# Patient Record
Sex: Female | Born: 1990 | Race: Black or African American | Hispanic: No | Marital: Single | Smoking: Never smoker
Health system: Southern US, Community
[De-identification: ages and names within clinical notes are randomized; demographics above are authoritative.]

## PROBLEM LIST (undated history)

## (undated) ENCOUNTER — Ambulatory Visit: Admission: EM | Payer: Commercial Managed Care - HMO | Source: Home / Self Care

## (undated) ENCOUNTER — Inpatient Hospital Stay (HOSPITAL_COMMUNITY): Payer: Self-pay

## (undated) DIAGNOSIS — Z789 Other specified health status: Secondary | ICD-10-CM

## (undated) DIAGNOSIS — Z8619 Personal history of other infectious and parasitic diseases: Secondary | ICD-10-CM

## (undated) DIAGNOSIS — Z3490 Encounter for supervision of normal pregnancy, unspecified, unspecified trimester: Secondary | ICD-10-CM

## (undated) HISTORY — PX: NO PAST SURGERIES: SHX2092

## (undated) HISTORY — DX: Personal history of other infectious and parasitic diseases: Z86.19

## (undated) HISTORY — PX: OTHER SURGICAL HISTORY: SHX169

---
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
## 2011-05-19 ENCOUNTER — Emergency Department (HOSPITAL_BASED_OUTPATIENT_CLINIC_OR_DEPARTMENT_OTHER)
Admission: EM | Admit: 2011-05-19 | Discharge: 2011-05-19 | Disposition: A | Discharge status: Home / Self Care | Payer: Medicaid Other | Attending: Emergency Medicine | Admitting: Emergency Medicine

## 2011-05-19 ENCOUNTER — Emergency Department (INDEPENDENT_AMBULATORY_CARE_PROVIDER_SITE_OTHER): Payer: Medicaid Other

## 2011-05-19 ENCOUNTER — Encounter (HOSPITAL_BASED_OUTPATIENT_CLINIC_OR_DEPARTMENT_OTHER): Payer: Self-pay | Admitting: *Deleted

## 2011-05-19 DIAGNOSIS — R059 Cough, unspecified: Secondary | ICD-10-CM | POA: Insufficient documentation

## 2011-05-19 DIAGNOSIS — B349 Viral infection, unspecified: Secondary | ICD-10-CM

## 2011-05-19 DIAGNOSIS — R05 Cough: Secondary | ICD-10-CM

## 2011-05-19 DIAGNOSIS — B9789 Other viral agents as the cause of diseases classified elsewhere: Secondary | ICD-10-CM | POA: Insufficient documentation

## 2011-05-19 LAB — PREGNANCY, URINE: Preg Test, Ur: NEGATIVE

## 2011-05-19 MED ORDER — DESLORATADINE 5 MG PO TABS: 5.0000 mg | ORAL_TABLET | Freq: Every day | ORAL | Status: DC

## 2011-05-19 NOTE — ED Provider Notes (Signed)
History     CSN: [id number]  Arrival date & time 05/19/11  1922   First MD Initiated Contact with Patient 05/19/11 1925      Chief Complaint  Patient presents with  . Cough    (Consider location/radiation/quality/duration/timing/severity/associated sxs/prior treatment) Patient is a 21 y.o. female presenting with cough. The history is provided by the patient. No language interpreter was used.  Cough This is a new problem. The current episode started more than 1 week ago. The problem occurs every few minutes. The problem has not changed since onset.The cough is non-productive. There has been no fever. Associated symptoms include rhinorrhea. Pertinent negatives include no chest pain, no chills, no weight loss, no ear congestion, no ear pain, no headaches, no sore throat, no myalgias, no shortness of breath, no wheezing and no eye redness. She has tried decongestants for the symptoms. The treatment provided no relief. She is not a smoker. Her past medical history does not include pneumonia.  Just wants to be checked out because she has had a cold with dry cough for > 7 days.  No f/c/r.  PERC negative.  No leg swelling.  Rhinorrhea and nasal congestion  History reviewed. No pertinent past medical history.  History reviewed. No pertinent past surgical history.  No family history on file.  History  Substance Use Topics  . Smoking status: Never Smoker   . Smokeless tobacco: Not on file  . Alcohol Use: No    OB History    Grav Para Term Preterm Abortions TAB SAB Ect Mult Living                  Review of Systems  Constitutional: Negative for chills and weight loss.  HENT: Positive for rhinorrhea. Negative for ear pain and sore throat.   Eyes: Negative for redness.  Respiratory: Positive for cough. Negative for shortness of breath and wheezing.   Cardiovascular: Negative for chest pain.  Gastrointestinal: Negative.   Genitourinary: Negative.   Musculoskeletal: Negative.  Negative  for myalgias.  Neurological: Negative for headaches.  Hematological: Negative.   Psychiatric/Behavioral: Negative.     Allergies  Review of patient's allergies indicates no known allergies.  Home Medications   Current Outpatient Rx  Name Route Sig Dispense Refill  . DIPHENHYDRAMINE HCL 25 MG PO TABS Oral Take 50 mg by mouth once.    Marland Kitchen DM-PHENYLEPHRINE-ACETAMINOPHEN 10-5-325 MG PO CAPS Oral Take 2 capsules by mouth every 4 (four) hours as needed. For congestion    . PHENOL 1.4 % MT LIQD Mouth/Throat Use as directed 2 sprays in the mouth or throat as needed. For sore throat    . DESLORATADINE 5 MG PO TABS Oral Take 1 tablet (5 mg total) by mouth daily. 7 tablet 0    BP 122/69  Pulse 82  Temp(Src) 97.9 F (36.6 C) (Oral)  Resp 18  SpO2 100%  Physical Exam  Constitutional: She is oriented to person, place, and time. She appears well-developed and well-nourished. No distress.  HENT:  Head: Normocephalic and atraumatic.  Right Ear: Tympanic membrane is not injected.  Left Ear: Tympanic membrane is not injected.  Mouth/Throat: Oropharynx is clear and moist. No oropharyngeal exudate.  Eyes: Conjunctivae are normal. Pupils are equal, round, and reactive to light.  Neck: Normal range of motion. Neck supple.  Cardiovascular: Normal rate and regular rhythm.   Pulmonary/Chest: Effort normal and breath sounds normal. She has no wheezes. She has no rales.  Abdominal: Soft. Bowel sounds are normal. There  is no tenderness. There is no rebound and no guarding.  Musculoskeletal: Normal range of motion.  Lymphadenopathy:    She has no cervical adenopathy.  Neurological: She is alert and oriented to person, place, and time.  Skin: Skin is warm and dry. She is not diaphoretic.  Psychiatric: She has a normal mood and affect.    ED Course  Procedures (including critical care time)   Labs Reviewed  PREGNANCY, URINE   Dg Chest 2 View  05/19/2011  *RADIOLOGY REPORT*  Clinical Data: Cough.   CHEST - 2 VIEW  Comparison:  None.  Findings:  The heart size and mediastinal contours are within normal limits.  Both lungs are clear.  The visualized skeletal structures are unremarkable.  IMPRESSION: No active cardiopulmonary disease.  Original Report Authenticated By: Danae Orleans, M.D.     1. Viral syndrome       MDM  Follow up with your family doctor, return for fevers chills, shortness of breath or any concerns       Dagon Budai K Victoria Henshaw-Rasch, MD 05/19/11 2131

## 2011-05-19 NOTE — ED Notes (Signed)
Has had cold symptoms for four days, now feeling worse today. Body aches, coughing, congestion

## 2011-05-19 NOTE — ED Notes (Signed)
Pt. States she had a period for 5 days 2 wks ago and 2 days after that she spotted for one day.  Pt. Reports she has done home pregnancy test and negative x 2.  Pt. Reports she is not on [facility] and is no trying to get pregnant.  Pt. Has an appt. With OBGYN for female issues on the 25th or 26th of this month.   Pt. Reports she never got her BC pills filled 6 mths ago and she lost the script.

## 2011-10-26 ENCOUNTER — Encounter (HOSPITAL_BASED_OUTPATIENT_CLINIC_OR_DEPARTMENT_OTHER): Payer: Self-pay | Admitting: Emergency Medicine

## 2011-10-26 ENCOUNTER — Emergency Department (HOSPITAL_BASED_OUTPATIENT_CLINIC_OR_DEPARTMENT_OTHER)
Admission: EM | Admit: 2011-10-26 | Discharge: 2011-10-26 | Disposition: A | Discharge status: Home / Self Care | Payer: Medicaid Other | Attending: Emergency Medicine | Admitting: Emergency Medicine

## 2011-10-26 DIAGNOSIS — Z202 Contact with and (suspected) exposure to infections with a predominantly sexual mode of transmission: Secondary | ICD-10-CM | POA: Insufficient documentation

## 2011-10-26 LAB — URINALYSIS, ROUTINE W REFLEX MICROSCOPIC
Bilirubin Urine: NEGATIVE
Glucose, UA: NEGATIVE mg/dL
Hgb urine dipstick: NEGATIVE
Ketones, ur: NEGATIVE mg/dL
Leukocytes, UA: NEGATIVE
Nitrite: NEGATIVE
Protein, ur: NEGATIVE mg/dL
Specific Gravity, Urine: 1.035 — ABNORMAL HIGH (ref 1.005–1.030)
Urobilinogen, UA: 1 mg/dL (ref 0.0–1.0)
pH: 6 (ref 5.0–8.0)

## 2011-10-26 LAB — WET PREP, GENITAL: Yeast Wet Prep HPF POC: NONE SEEN

## 2011-10-26 LAB — PREGNANCY, URINE: Preg Test, Ur: NEGATIVE

## 2011-10-26 MED ORDER — AZITHROMYCIN 250 MG PO TABS
1000.0000 mg | ORAL_TABLET | Freq: Once | ORAL | Status: AC
  Administered 2011-10-26: 1000 mg via ORAL
  Filled 2011-10-26: qty 4

## 2011-10-26 MED ORDER — CEFTRIAXONE SODIUM 250 MG IJ SOLR
250.0000 mg | Freq: Once | INTRAMUSCULAR | Status: AC
  Administered 2011-10-26: 250 mg via INTRAMUSCULAR
  Filled 2011-10-26: qty 250

## 2011-10-26 MED ORDER — LIDOCAINE HCL 2 % IJ SOLN
INTRAMUSCULAR | Status: AC
  Administered 2011-10-26: 1 mg
  Filled 2011-10-26: qty 1
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
## 2011-10-29 NOTE — ED Notes (Signed)
+   Gonorrhea +chlamydia Patient treated with Rocephin and Zithromax.[facility] letter.
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
## 2011-12-09 ENCOUNTER — Encounter (HOSPITAL_BASED_OUTPATIENT_CLINIC_OR_DEPARTMENT_OTHER): Payer: Self-pay | Admitting: Emergency Medicine

## 2011-12-09 ENCOUNTER — Emergency Department (HOSPITAL_BASED_OUTPATIENT_CLINIC_OR_DEPARTMENT_OTHER)
Admission: EM | Admit: 2011-12-09 | Discharge: 2011-12-09 | Disposition: A | Discharge status: Home / Self Care | Payer: Medicaid Other | Attending: Emergency Medicine | Admitting: Emergency Medicine

## 2011-12-09 DIAGNOSIS — T22319A Burn of third degree of unspecified forearm, initial encounter: Secondary | ICD-10-CM | POA: Insufficient documentation

## 2011-12-09 DIAGNOSIS — Y92009 Unspecified place in unspecified non-institutional (private) residence as the place of occurrence of the external cause: Secondary | ICD-10-CM | POA: Insufficient documentation

## 2011-12-09 DIAGNOSIS — T300 Burn of unspecified body region, unspecified degree: Secondary | ICD-10-CM

## 2011-12-09 DIAGNOSIS — X19XXXA Contact with other heat and hot substances, initial encounter: Secondary | ICD-10-CM | POA: Insufficient documentation

## 2011-12-09 DIAGNOSIS — Z23 Encounter for immunization: Secondary | ICD-10-CM | POA: Insufficient documentation

## 2011-12-09 MED ORDER — TETANUS-DIPHTH-ACELL PERTUSSIS 5-2.5-18.5 LF-MCG/0.5 IM SUSP
0.5000 mL | Freq: Once | INTRAMUSCULAR | Status: AC
Start: 2011-12-09 — End: 2011-12-09
  Administered 2011-12-09: 0.5 mL via INTRAMUSCULAR
  Filled 2011-12-09: qty 0.5

## 2011-12-09 MED ORDER — SILVER SULFADIAZINE 1 % EX CREA
TOPICAL_CREAM | Freq: Once | CUTANEOUS | Status: AC
  Administered 2011-12-09: 1 via TOPICAL
  Filled 2011-12-09: qty 85
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
## 2012-03-10 ENCOUNTER — Emergency Department (HOSPITAL_BASED_OUTPATIENT_CLINIC_OR_DEPARTMENT_OTHER): Payer: Medicaid Other

## 2012-03-10 ENCOUNTER — Emergency Department (HOSPITAL_BASED_OUTPATIENT_CLINIC_OR_DEPARTMENT_OTHER)
Admission: EM | Admit: 2012-03-10 | Discharge: 2012-03-10 | Disposition: A | Discharge status: Home / Self Care | Payer: Medicaid Other | Attending: Emergency Medicine | Admitting: Emergency Medicine

## 2012-03-10 ENCOUNTER — Encounter (HOSPITAL_BASED_OUTPATIENT_CLINIC_OR_DEPARTMENT_OTHER): Payer: Self-pay

## 2012-03-10 DIAGNOSIS — N760 Acute vaginitis: Secondary | ICD-10-CM | POA: Insufficient documentation

## 2012-03-10 DIAGNOSIS — R109 Unspecified abdominal pain: Secondary | ICD-10-CM | POA: Insufficient documentation

## 2012-03-10 DIAGNOSIS — N949 Unspecified condition associated with female genital organs and menstrual cycle: Secondary | ICD-10-CM | POA: Insufficient documentation

## 2012-03-10 DIAGNOSIS — R002 Palpitations: Secondary | ICD-10-CM | POA: Insufficient documentation

## 2012-03-10 DIAGNOSIS — R102 Pelvic and perineal pain: Secondary | ICD-10-CM

## 2012-03-10 LAB — URINALYSIS, ROUTINE W REFLEX MICROSCOPIC
Ketones, ur: NEGATIVE mg/dL
Leukocytes, UA: NEGATIVE
Nitrite: NEGATIVE
Protein, ur: NEGATIVE mg/dL
pH: 7 (ref 5.0–8.0)

## 2012-03-10 LAB — OCCULT BLOOD X 1 CARD TO LAB, STOOL: Fecal Occult Bld: NEGATIVE

## 2012-03-10 MED ORDER — AZITHROMYCIN 250 MG PO TABS
1000.0000 mg | ORAL_TABLET | Freq: Once | ORAL | Status: AC
  Administered 2012-03-10: 1000 mg via ORAL
  Filled 2012-03-10: qty 4

## 2012-03-10 MED ORDER — LIDOCAINE HCL (PF) 1 % IJ SOLN
5.0000 mL | Freq: Once | INTRAMUSCULAR | Status: AC
  Administered 2012-03-10: 2.3 mL

## 2012-03-10 MED ORDER — CEFTRIAXONE SODIUM 250 MG IJ SOLR
250.0000 mg | Freq: Once | INTRAMUSCULAR | Status: AC
  Administered 2012-03-10: 250 mg via INTRAMUSCULAR
  Filled 2012-03-10: qty 250

## 2012-03-10 MED ORDER — HYDROCODONE-ACETAMINOPHEN 5-325 MG PO TABS: 1.0000 | ORAL_TABLET | ORAL | Status: DC | PRN

## 2012-03-10 MED ORDER — METRONIDAZOLE 500 MG PO TABS: 500.0000 mg | ORAL_TABLET | Freq: Two times a day (BID) | ORAL | Status: DC

## 2012-03-10 MED ORDER — HYDROCODONE-ACETAMINOPHEN 5-325 MG PO TABS
1.0000 | ORAL_TABLET | Freq: Once | ORAL | Status: AC
  Administered 2012-03-10: 1 via ORAL
  Filled 2012-03-10: qty 1

## 2012-03-10 MED ORDER — LIDOCAINE HCL (PF) 1 % IJ SOLN
INTRAMUSCULAR | Status: AC
  Administered 2012-03-10: 2.3 mL
  Filled 2012-03-10: qty 5
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
## 2012-03-10 NOTE — ED Notes (Signed)
C/o lower mid pelvic pain, dysuria-started last night

## 2012-03-10 NOTE — ED Provider Notes (Signed)
History     CSN: [id number]  Arrival date & time 03/10/12  1227   First MD Initiated Contact with Patient 03/10/12 1249      Chief Complaint  Patient presents with  . Pelvic Pain    (Consider location/radiation/quality/duration/timing/severity/associated sxs/prior treatment) HPI History provided by pt.   Pt has had intermittent, severely sharp pains in pelvis since yesterday evening.  Occurs with movement and palpation and resolves w/ rest.  No associated fever, N/V/D or GU sx.  Saw blood on tp when wiped after having a BM 2 days ago for the first time, and has not seen it since.  BMs have been regular and denies rectal pain.  Has not taken anything for pain.  No PMH.   History reviewed. No pertinent past medical history.  History reviewed. No pertinent past surgical history.  No family history on file.  History  Substance Use Topics  . Smoking status: Never Smoker   . Smokeless tobacco: Not on file  . Alcohol Use: No    OB History    Grav Para Term Preterm Abortions TAB SAB Ect Mult Living                  Review of Systems  All other systems reviewed and are negative.    Allergies  Review of patient's allergies indicates no known allergies.  Home Medications   Current Outpatient Rx  Name  Route  Sig  Dispense  Refill  . DESLORATADINE 5 MG PO TABS   Oral   Take 1 tablet (5 mg total) by mouth daily.   7 tablet   0   . DIPHENHYDRAMINE HCL 25 MG PO TABS   Oral   Take 50 mg by mouth once.         Marland Kitchen DM-PHENYLEPHRINE-ACETAMINOPHEN 10-5-325 MG PO CAPS   Oral   Take 2 capsules by mouth every 4 (four) hours as needed. For congestion         . PHENOL 1.4 % MT LIQD   Mouth/Throat   Use as directed 2 sprays in the mouth or throat as needed. For sore throat           BP 142/63  Pulse 66  Temp 98.2 F (36.8 C) (Oral)  Resp 16  Ht 5' (1.524 m)  Wt 147 lb (66.679 kg)  BMI 28.71 kg/m2  SpO2 100%  LMP 02/12/2012  Physical Exam  Nursing note and  vitals reviewed. Constitutional: She is oriented to person, place, and time. She appears well-developed and well-nourished. No distress.  HENT:  Head: Normocephalic and atraumatic.  Eyes:       Normal appearance  Neck: Normal range of motion.  Cardiovascular: Normal rate and regular rhythm.   Pulmonary/Chest: Effort normal and breath sounds normal. No respiratory distress.  Abdominal: Soft. Bowel sounds are normal. She exhibits no distension and no mass. There is no rebound and no guarding.       Mild epigastric tenderness and diffuse tenderness across lower abd and suprapubic w/ guarding.   Genitourinary:       No CVA tenderness.  Nml external genitalia.  No vaginal discharge/bleeding.  Cervix closed and appears nml.  Diffuse tenderness on bimanual.  Nml rectal tone.  No external hemorrhoids.  No gross blood in rectum.  Non-tender.    Musculoskeletal: Normal range of motion.  Neurological: She is alert and oriented to person, place, and time.  Skin: Skin is warm and dry. No rash noted.  Psychiatric: She  has a normal mood and affect. Her behavior is normal.    ED Course  Procedures (including critical care time)  Labs Reviewed  WET PREP, GENITAL - Abnormal; Notable for the following:    Clue Cells Wet Prep HPF POC TOO NUMEROUS TO COUNT (*)     WBC, Wet Prep HPF POC FEW (*)     All other components within normal limits  URINALYSIS, ROUTINE W REFLEX MICROSCOPIC  PREGNANCY, URINE  OCCULT BLOOD X 1 CARD TO LAB, STOOL  GC/CHLAMYDIA PROBE AMP   US Transvaginal Non-ob  03/10/2012  *RADIOLOGY REPORT*  Clinical Data: Pelvic pain.  TRANSABDOMINAL AND TRANSVAGINAL ULTRASOUND OF PELVIS Technique:  Both transabdominal and transvaginal ultrasound examinations of the pelvis were performed. Transabdominal technique was performed for global imaging of the pelvis including uterus, ovaries, adnexal regions, and pelvic cul-de-sac.  It was necessary to proceed with endovaginal exam following the  transabdominal exam to visualize the ovaries.  Comparison:  None  Findings:  Uterus: The uterus measures approximately 8.0 x 4.5 x 4.7 cm and has a normal sonographic appearance.  Endometrium: Homogeneous endometrium measures 13 mm in greatest thickness.  There is no evidence of endometrial fluid.  Right ovary:  The right ovary measures approximately 6.2 x 3.7 x 5.4 cm and contains a well circumscribed complex cyst with complex internal fluid and septations present.  This cyst measures approximately 5.2 x 2.6 x 3.9 cm.  Left ovary: The left ovary measures 3.7 x 1.8 x 2.2 cm and has a normal sonographic appearance.  Other findings: Trace free fluid present in the pelvis which is likely physiologic.  IMPRESSION: 5 cm complex right ovarian cyst.  This is likely a hemorrhagic cyst.  Recommend sonographic follow-up in 6 - 12 weeks.   Original Report Authenticated By: Irish Lack, M.D.    US Pelvis Complete  03/10/2012  *RADIOLOGY REPORT*  Clinical Data: Pelvic pain.  TRANSABDOMINAL AND TRANSVAGINAL ULTRASOUND OF PELVIS Technique:  Both transabdominal and transvaginal ultrasound examinations of the pelvis were performed. Transabdominal technique was performed for global imaging of the pelvis including uterus, ovaries, adnexal regions, and pelvic cul-de-sac.  It was necessary to proceed with endovaginal exam following the transabdominal exam to visualize the ovaries.  Comparison:  None  Findings:  Uterus: The uterus measures approximately 8.0 x 4.5 x 4.7 cm and has a normal sonographic appearance.  Endometrium: Homogeneous endometrium measures 13 mm in greatest thickness.  There is no evidence of endometrial fluid.  Right ovary:  The right ovary measures approximately 6.2 x 3.7 x 5.4 cm and contains a well circumscribed complex cyst with complex internal fluid and septations present.  This cyst measures approximately 5.2 x 2.6 x 3.9 cm.  Left ovary: The left ovary measures 3.7 x 1.8 x 2.2 cm and has a normal  sonographic appearance.  Other findings: Trace free fluid present in the pelvis which is likely physiologic.  IMPRESSION: 5 cm complex right ovarian cyst.  This is likely a hemorrhagic cyst.  Recommend sonographic follow-up in 6 - 12 weeks.   Original Report Authenticated By: Irish Lack, M.D.      1. Pelvic pain   2. Bacterial vaginosis       MDM  21yo healthy F presents w/ severe pelvic pain since last night.  No associated sx, w/ exception of rectal bleeding (blood seen on tp only) 2d ago.  On exam, afebrile, non-toxic appearing, diffuse lower abd tenderness w/ guarding, diffuse tenderness on bimanual, nml rectum.  Unable to visualize  cervix but no vaginal discharge.  U/A and urine preg neg.  Wet prep pending.  Will treat for possible PID w/ zithromax and rocephin but because there is no vaginal discharge, which is inconsistent w/ PID, will US pelvis as well.   Pt has received one po vicodin for pain.  Will reassess shortly.  1:16 PM   Pt reports that pain improved.  On re-examination, lower abd is less tender; no guarding.  US shows 5cm hemorrhagic cyst.  Results discussed w/ pt.  Advised her to f/u with her Gyn in 6-12 weeks for repeat US.  Wet prep shows TNTC clue cells.  Hemoccult neg.  Patient eloped before receiving these results or discharge paperwork.  Because the BV is asx, tmnt likely unnecessary.  4:07 PM        Otilio Miu, PA 03/10/12 [phone]

## 2012-03-11 LAB — GC/CHLAMYDIA PROBE AMP: GC Probe RNA: NEGATIVE

## 2012-03-11 NOTE — ED Provider Notes (Signed)
Medical screening examination/treatment/procedure(s) were performed by non-physician practitioner and as supervising physician I was immediately available for consultation/collaboration.   Charles B. Sheldon, MD 03/11/12 0952 

## 2012-07-07 ENCOUNTER — Emergency Department (HOSPITAL_BASED_OUTPATIENT_CLINIC_OR_DEPARTMENT_OTHER)
Admission: EM | Admit: 2012-07-07 | Discharge: 2012-07-07 | Disposition: A | Discharge status: Home / Self Care | Payer: Medicaid Other | Attending: Emergency Medicine | Admitting: Emergency Medicine

## 2012-07-07 ENCOUNTER — Encounter (HOSPITAL_BASED_OUTPATIENT_CLINIC_OR_DEPARTMENT_OTHER): Payer: Self-pay | Admitting: *Deleted

## 2012-07-07 DIAGNOSIS — L03032 Cellulitis of left toe: Secondary | ICD-10-CM

## 2012-07-07 DIAGNOSIS — Z872 Personal history of diseases of the skin and subcutaneous tissue: Secondary | ICD-10-CM | POA: Insufficient documentation

## 2012-07-07 DIAGNOSIS — Z792 Long term (current) use of antibiotics: Secondary | ICD-10-CM | POA: Insufficient documentation

## 2012-07-07 DIAGNOSIS — L03039 Cellulitis of unspecified toe: Secondary | ICD-10-CM | POA: Insufficient documentation

## 2012-07-07 MED ORDER — CEPHALEXIN 500 MG PO CAPS: 500.0000 mg | ORAL_CAPSULE | Freq: Four times a day (QID) | ORAL | Status: DC

## 2012-07-07 MED ORDER — HYDROCODONE-ACETAMINOPHEN 5-325 MG PO TABS: 2.0000 | ORAL_TABLET | ORAL | Status: DC | PRN

## 2012-07-07 NOTE — ED Provider Notes (Signed)
History    This chart was scribed for Nelia Shi, MD by Toya Smothers, ED Scribe. The patient was seen in room MH05/MH05. Patient's care was started at 2056.  CSN: [id number]  Arrival date & time 07/07/12  2056   First MD Initiated Contact with Patient 07/07/12 2223      Chief Complaint  Patient presents with  . Toe Pain   HPI  Lindsey Beard is a 22 y.o. female with h/o ingrown toe nails nails, who presents to the Emergency Department complaining of 1 month of new, gradual onset, progressive, moderate right great toe pain near the cuticle. No injury mechanism reported. Pain is described as soreness, worse with pressure, and alleviated by nothing. Pt reports that pain is worsening, and she has noticed a discharge from underneath her toe nail. Pt is otherwise healthy. Symptoms have not been treated PTA. No fever, chills, cough, congestion, rhinorrhea, chest pain, SOB, or n/v/d. Pt denies use of tobacco, alcohol, and illicit drug use.   History reviewed. No pertinent past medical history.  History reviewed. No pertinent past surgical history.  History reviewed. No pertinent family history.  History  Substance Use Topics  . Smoking status: Never Smoker   . Smokeless tobacco: Not on file  . Alcohol Use: No    Review of Systems  All other systems reviewed and are negative.    Allergies  Review of patient's allergies indicates no known allergies.  Home Medications   Current Outpatient Rx  Name  Route  Sig  Dispense  Refill  . cephALEXin (KEFLEX) 500 MG capsule   Oral   Take 1 capsule (500 mg total) by mouth 4 (four) times daily.   28 capsule   0   . EXPIRED: desloratadine (CLARINEX) 5 MG tablet   Oral   Take 1 tablet (5 mg total) by mouth daily.   7 tablet   0   . diphenhydrAMINE (BENADRYL) 25 MG tablet   Oral   Take 50 mg by mouth once.         Marland Kitchen DM-Phenylephrine-Acetaminophen (VICKS DAYQUIL COLD & FLU) 10-5-325 MG CAPS   Oral   Take 2 capsules by mouth  every 4 (four) hours as needed. For congestion         . HYDROcodone-acetaminophen (NORCO/VICODIN) 5-325 MG per tablet   Oral   Take 1 tablet by mouth every 4 (four) hours as needed for pain.   20 tablet   0   . HYDROcodone-acetaminophen (NORCO/VICODIN) 5-325 MG per tablet   Oral   Take 2 tablets by mouth every 4 (four) hours as needed for pain.   15 tablet   0   . metroNIDAZOLE (FLAGYL) 500 MG tablet   Oral   Take 1 tablet (500 mg total) by mouth 2 (two) times daily.   14 tablet   0   . phenol (CHLORASEPTIC) 1.4 % LIQD   Mouth/Throat   Use as directed 2 sprays in the mouth or throat as needed. For sore throat           BP 125/66  Pulse 78  Temp(Src) 98.2 F (36.8 C) (Oral)  Resp 16  Ht 5' (1.524 m)  Wt 148 lb (67.132 kg)  BMI 28.9 kg/m2  SpO2 100%  LMP 06/14/2012  Physical Exam  Nursing note and vitals reviewed. Constitutional: She is oriented to person, place, and time. She appears well-developed and well-nourished. No distress.  HENT:  Head: Normocephalic and atraumatic.  Eyes: Pupils are equal,  round, and reactive to light.  Neck: Normal range of motion.  Cardiovascular: Normal rate and intact distal pulses.   Pulmonary/Chest: No respiratory distress.  Abdominal: Normal appearance. She exhibits no distension.  Musculoskeletal: Normal range of motion.       Feet:  Neurological: She is alert and oriented to person, place, and time. No cranial nerve deficit.  Skin: Skin is warm and dry. No rash noted.  Psychiatric: She has a normal mood and affect. Her behavior is normal.    ED Course  INCISION AND DRAINAGE Date/Time: 07/07/2012 10:54 PM Performed by: Nelia Shi Authorized by: Nelia Shi Consent: Verbal consent obtained. Risks and benefits: risks, benefits and alternatives were discussed Consent given by: patient Patient understanding: patient states understanding of the procedure being performed Patient identity confirmed: verbally with  patient Type: abscess Body area: lower extremity Location details: right big toe Anesthesia: local infiltration Local anesthetic: lidocaine 2% without epinephrine Anesthetic total: 1 ml Patient sedated: no Scalpel size: 11 Needle gauge: 22 Incision type: single straight Complexity: simple Drainage: bloody Drainage amount: moderate Wound treatment: wound left open Packing material: none Patient tolerance: Patient tolerated the procedure well with no immediate complications.   DIAGNOSTIC STUDIES: Oxygen Saturation is 100% on room air, normal by my interpretation.    COORDINATION OF CARE: 22:24- Evaluated Pt. Pt is awake, alert, and without distress. 22:28- Patient understand and agree with initial ED impression and plan with expectations set for ED visit.   Labs Reviewed - No data to display No results found.   1. Paronychia of great toe, left       MDM  I personally performed the services described in this documentation, which was scribed in my presence. The recorded information has been reviewed and considered.    Nelia Shi, MD 07/07/12 2256

## 2012-07-07 NOTE — ED Notes (Signed)
MD with pt  

## 2012-07-07 NOTE — ED Notes (Signed)
Wound care is complete. 

## 2012-07-07 NOTE — ED Notes (Signed)
Pt presents to ER with c/o right big toe pain. Pt does have swelling and tenderness to the toe. Does not remember injuring the toe. Pain began about one month ago.

## 2012-07-07 NOTE — ED Notes (Signed)
Pt has hx of ingrown toenail and her right great toe has been bothering her for 1 month. Also states when toe hurts, her inner thigh hurts as well.

## 2012-11-07 ENCOUNTER — Encounter (HOSPITAL_BASED_OUTPATIENT_CLINIC_OR_DEPARTMENT_OTHER): Payer: Self-pay

## 2012-11-07 ENCOUNTER — Emergency Department (HOSPITAL_BASED_OUTPATIENT_CLINIC_OR_DEPARTMENT_OTHER)
Admission: EM | Admit: 2012-11-07 | Discharge: 2012-11-07 | Disposition: A | Discharge status: Home / Self Care | Payer: Medicaid Other | Attending: Emergency Medicine | Admitting: Emergency Medicine

## 2012-11-07 DIAGNOSIS — Z79899 Other long term (current) drug therapy: Secondary | ICD-10-CM | POA: Insufficient documentation

## 2012-11-07 DIAGNOSIS — N949 Unspecified condition associated with female genital organs and menstrual cycle: Secondary | ICD-10-CM | POA: Insufficient documentation

## 2012-11-07 DIAGNOSIS — R102 Pelvic and perineal pain: Secondary | ICD-10-CM

## 2012-11-07 DIAGNOSIS — Z3202 Encounter for pregnancy test, result negative: Secondary | ICD-10-CM | POA: Insufficient documentation

## 2012-11-07 LAB — URINALYSIS, ROUTINE W REFLEX MICROSCOPIC
Bilirubin Urine: NEGATIVE
Nitrite: NEGATIVE
Specific Gravity, Urine: 1.024 (ref 1.005–1.030)
pH: 6.5 (ref 5.0–8.0)

## 2012-11-07 LAB — URINE MICROSCOPIC-ADD ON

## 2012-11-07 LAB — WET PREP, GENITAL
Trich, Wet Prep: NONE SEEN
Yeast Wet Prep HPF POC: NONE SEEN

## 2012-11-07 MED ORDER — NITROFURANTOIN MONOHYD MACRO 100 MG PO CAPS: 100.0000 mg | ORAL_CAPSULE | Freq: Two times a day (BID) | ORAL | Status: DC

## 2012-11-07 MED ORDER — HYDROCODONE-ACETAMINOPHEN 5-325 MG PO TABS: ORAL_TABLET | ORAL | Status: DC

## 2012-11-07 MED ORDER — NAPROXEN 500 MG PO TABS: 500.0000 mg | ORAL_TABLET | Freq: Two times a day (BID) | ORAL | Status: DC

## 2012-11-07 NOTE — ED Notes (Signed)
C/ lower pelvic pain x 2 weeks-increase pain 2 days

## 2012-11-07 NOTE — ED Provider Notes (Signed)
History    CSN: [id number] Arrival date & time 11/07/12  1437  First MD Initiated Contact with Patient 11/07/12 1449     Chief Complaint  Patient presents with  . Pelvic Pain   (Consider location/radiation/quality/duration/timing/severity/associated sxs/prior Treatment) HPI Comments: Patient with h/o R ovarian cyst 11/13, BV -- presents with lower abdominal/pelvic pain, sharp, intermittent, left greater than right for the past 2 weeks. Patient states that her last menstrual period was 09/15/2012 however she has a history of irregular periods and does not think that she is pregnant. Last period was normal for her in duration and heaviness. She does not have any current vaginal bleeding. She denies urinary symptoms including dysuria and hematuria. Patient experiences this pain every day. She does not gynecologist. She denies fever, nausea, vomiting. Onset of symptoms acute. Course is intermittent. Nothing makes symptoms better or worse.  Patient is a 22 y.o. female presenting with pelvic pain. The history is provided by the patient and medical records.  Pelvic Pain Pertinent negatives include no abdominal pain, chest pain, coughing, fever, headaches, myalgias, nausea, rash, sore throat or vomiting.   History reviewed. No pertinent past medical history. History reviewed. No pertinent past surgical history. No family history on file. History  Substance Use Topics  . Smoking status: Never Smoker   . Smokeless tobacco: Not on file  . Alcohol Use: No   OB History   Grav Para Term Preterm Abortions TAB SAB Ect Mult Living                 Review of Systems  Constitutional: Negative for fever.  HENT: Negative for sore throat and rhinorrhea.   Eyes: Negative for redness.  Respiratory: Negative for cough.   Cardiovascular: Negative for chest pain.  Gastrointestinal: Negative for nausea, vomiting, abdominal pain and diarrhea.  Genitourinary: Positive for pelvic pain. Negative for dysuria,  frequency, vaginal bleeding and vaginal discharge.  Musculoskeletal: Negative for myalgias.  Skin: Negative for rash.  Neurological: Negative for headaches.    Allergies  Review of patient's allergies indicates no known allergies.  Home Medications   Current Outpatient Rx  Name  Route  Sig  Dispense  Refill  . cephALEXin (KEFLEX) 500 MG capsule   Oral   Take 1 capsule (500 mg total) by mouth 4 (four) times daily.   28 capsule   0   . EXPIRED: desloratadine (CLARINEX) 5 MG tablet   Oral   Take 1 tablet (5 mg total) by mouth daily.   7 tablet   0   . diphenhydrAMINE (BENADRYL) 25 MG tablet   Oral   Take 50 mg by mouth once.         Marland Kitchen DM-Phenylephrine-Acetaminophen (VICKS DAYQUIL COLD & FLU) 10-5-325 MG CAPS   Oral   Take 2 capsules by mouth every 4 (four) hours as needed. For congestion         . HYDROcodone-acetaminophen (NORCO/VICODIN) 5-325 MG per tablet   Oral   Take 1 tablet by mouth every 4 (four) hours as needed for pain.   20 tablet   0   . HYDROcodone-acetaminophen (NORCO/VICODIN) 5-325 MG per tablet   Oral   Take 2 tablets by mouth every 4 (four) hours as needed for pain.   15 tablet   0   . metroNIDAZOLE (FLAGYL) 500 MG tablet   Oral   Take 1 tablet (500 mg total) by mouth 2 (two) times daily.   14 tablet   0   . phenol (  CHLORASEPTIC) 1.4 % LIQD   Mouth/Throat   Use as directed 2 sprays in the mouth or throat as needed. For sore throat          BP 104/49  Pulse 76  Temp(Src) 99 F (37.2 C) (Oral)  Resp 18  Ht 5' (1.524 m)  Wt 148 lb (67.132 kg)  BMI 28.9 kg/m2  SpO2 99%  LMP 09/15/2012 Physical Exam  Nursing note and vitals reviewed. Constitutional: She appears well-developed and well-nourished.  HENT:  Head: Normocephalic and atraumatic.  Eyes: Conjunctivae are normal. Right eye exhibits no discharge. Left eye exhibits no discharge.  Neck: Normal range of motion. Neck supple.  Cardiovascular: Normal rate, regular rhythm and  normal heart sounds.   Pulmonary/Chest: Effort normal and breath sounds normal.  Abdominal: Soft. There is generalized tenderness. There is no rigidity, no rebound, no guarding, no tenderness at McBurney's point and negative Murphy's sign.    Genitourinary: There is no rash on the right labia. There is no rash on the left labia. Uterus is not tender. Cervix exhibits no motion tenderness, no discharge and no friability. Right adnexum displays tenderness (mild). Right adnexum displays no mass and no fullness. Left adnexum displays tenderness (mild). Left adnexum displays no mass and no fullness. No erythema, tenderness or bleeding around the vagina. No signs of injury around the vagina. Vaginal discharge (white) found.  Neurological: She is alert.  Skin: Skin is warm and dry.  Psychiatric: She has a normal mood and affect.    ED Course  Procedures (including critical care time) Labs Reviewed  WET PREP, GENITAL - Abnormal; Notable for the following:    Clue Cells Wet Prep HPF POC FEW (*)    WBC, Wet Prep HPF POC FEW (*)    All other components within normal limits  URINALYSIS, ROUTINE W REFLEX MICROSCOPIC - Abnormal; Notable for the following:    Leukocytes, UA MODERATE (*)    All other components within normal limits  URINE MICROSCOPIC-ADD ON - Abnormal; Notable for the following:    Bacteria, UA FEW (*)    All other components within normal limits  GC/CHLAMYDIA PROBE AMP  URINE CULTURE  PREGNANCY, URINE   No results found. 1. Pelvic pain     2:51 PM Patient seen and examined. Work-up initiated. Medications ordered.   Vital signs reviewed and are as follows: Filed Vitals:   11/07/12 1446  BP: 104/49  Pulse: 76  Temp: 99 F (37.2 C)  Resp: 18   3:40 PM Pelvic exam performed with nurse chaperone.   Discussed two options at this point: ultrasound to eval for cysts or f/u with GYN and have them decide further work-up. Pt most comfortable with follow-up at this is second time. I  do not suspect ovarian torsion, TOA given exam and history. Awaiting wet prep results.   The patient was urged to return to the Emergency Department immediately with worsening of current symptoms, worsening abdominal pain, persistent vomiting, blood noted in stools, fever, or any other concerns. The patient verbalized understanding.   Patient counseled on use of narcotic pain medications. Counseled not to combine these medications with others containing tylenol. Urged not to drink alcohol, drive, or perform any other activities that requires focus while taking these medications. The patient verbalizes understanding and agrees with the plan.   MDM  Pelvic pain, intermittent, 2 weeks. Do not suspect torsion, PID/TOA. UA shows possible UTI so will treat. Suspect patient has recurrent ovarian cyst. Pt to f/u with GYN.  Referral given. Do not suspect appendicitis or abdominal etiology. She appears very well, pain controlled.   Renne Crigler, PA-C 11/07/12 1555

## 2012-11-07 NOTE — ED Provider Notes (Signed)
Medical screening examination/treatment/procedure(s) were performed by non-physician practitioner and as supervising physician I was immediately available for consultation/collaboration.    Christopher J. Pollina, MD 11/07/12 2045 

## 2012-11-07 NOTE — ED Notes (Signed)
Pelvic cart to bedside.  Patient asked to provide clean catch urine sample & change into gown.

## 2012-11-08 LAB — GC/CHLAMYDIA PROBE AMP: CT Probe RNA: NEGATIVE

## 2012-11-09 LAB — URINE CULTURE

## 2012-11-10 ENCOUNTER — Telehealth (HOSPITAL_COMMUNITY): Payer: Self-pay | Admitting: Emergency Medicine

## 2012-11-10 NOTE — ED Notes (Signed)
Post ED Visit - Positive Culture Follow-up  Culture report reviewed by antimicrobial stewardship pharmacist: []  Wes Dulaney, Pharm.D., BCPS [x]  Celedonio Miyamoto, Pharm.D., BCPS []  Georgina Pillion, [address].D., BCPS []  [place], [address].D., BCPS, AAHIVP []  Estella Husk, Pharm.D., BCPS, AAHIVP  Positive urine culture Treated with Macrobid, organism sensitive to the same and no further patient follow-up is required at this time.  Kylie A Holland 11/10/2012, 2:44 PM

## 2012-11-14 ENCOUNTER — Emergency Department (HOSPITAL_BASED_OUTPATIENT_CLINIC_OR_DEPARTMENT_OTHER): Payer: Medicaid Other

## 2012-11-14 ENCOUNTER — Encounter (HOSPITAL_BASED_OUTPATIENT_CLINIC_OR_DEPARTMENT_OTHER): Payer: Self-pay | Admitting: *Deleted

## 2012-11-14 ENCOUNTER — Emergency Department (HOSPITAL_BASED_OUTPATIENT_CLINIC_OR_DEPARTMENT_OTHER)
Admission: EM | Admit: 2012-11-14 | Discharge: 2012-11-14 | Disposition: A | Discharge status: Home / Self Care | Payer: Medicaid Other | Attending: Emergency Medicine | Admitting: Emergency Medicine

## 2012-11-14 DIAGNOSIS — R197 Diarrhea, unspecified: Secondary | ICD-10-CM | POA: Insufficient documentation

## 2012-11-14 DIAGNOSIS — O26859 Spotting complicating pregnancy, unspecified trimester: Secondary | ICD-10-CM | POA: Insufficient documentation

## 2012-11-14 DIAGNOSIS — Z3201 Encounter for pregnancy test, result positive: Secondary | ICD-10-CM | POA: Insufficient documentation

## 2012-11-14 DIAGNOSIS — O209 Hemorrhage in early pregnancy, unspecified: Secondary | ICD-10-CM | POA: Insufficient documentation

## 2012-11-14 DIAGNOSIS — O2390 Unspecified genitourinary tract infection in pregnancy, unspecified trimester: Secondary | ICD-10-CM | POA: Insufficient documentation

## 2012-11-14 DIAGNOSIS — O9989 Other specified diseases and conditions complicating pregnancy, childbirth and the puerperium: Secondary | ICD-10-CM | POA: Insufficient documentation

## 2012-11-14 DIAGNOSIS — B9689 Other specified bacterial agents as the cause of diseases classified elsewhere: Secondary | ICD-10-CM

## 2012-11-14 DIAGNOSIS — O98819 Other maternal infectious and parasitic diseases complicating pregnancy, unspecified trimester: Secondary | ICD-10-CM | POA: Insufficient documentation

## 2012-11-14 DIAGNOSIS — R109 Unspecified abdominal pain: Secondary | ICD-10-CM | POA: Insufficient documentation

## 2012-11-14 LAB — URINE MICROSCOPIC-ADD ON

## 2012-11-14 LAB — URINALYSIS, ROUTINE W REFLEX MICROSCOPIC
Bilirubin Urine: NEGATIVE
Glucose, UA: NEGATIVE mg/dL
Hgb urine dipstick: NEGATIVE
Specific Gravity, Urine: 1.03 (ref 1.005–1.030)
pH: 6 (ref 5.0–8.0)

## 2012-11-14 LAB — CBC WITH DIFFERENTIAL/PLATELET
Eosinophils Relative: 3 % (ref 0–5)
HCT: 34.8 % — ABNORMAL LOW (ref 36.0–46.0)
Lymphocytes Relative: 33 % (ref 12–46)
Lymphs Abs: 2.5 10*3/uL (ref 0.7–4.0)
MCV: 84.9 fL (ref 78.0–100.0)
Monocytes Absolute: 0.9 10*3/uL (ref 0.1–1.0)
RBC: 4.1 MIL/uL (ref 3.87–5.11)
WBC: 7.5 10*3/uL (ref 4.0–10.5)

## 2012-11-14 LAB — BASIC METABOLIC PANEL
CO2: 26 mEq/L (ref 19–32)
Calcium: 9.8 mg/dL (ref 8.4–10.5)
Chloride: 98 mEq/L (ref 96–112)
Creatinine, Ser: 0.7 mg/dL (ref 0.50–1.10)
Glucose, Bld: 80 mg/dL (ref 70–99)
Sodium: 136 mEq/L (ref 135–145)

## 2012-11-14 LAB — WET PREP, GENITAL: Trich, Wet Prep: NONE SEEN

## 2012-11-14 MED ORDER — DIPHENOXYLATE-ATROPINE 2.5-0.025 MG PO TABS
2.0000 | ORAL_TABLET | ORAL | Status: AC
  Administered 2012-11-14: 2 via ORAL
  Filled 2012-11-14: qty 2

## 2012-11-14 MED ORDER — METRONIDAZOLE 500 MG PO TABS: 500.0000 mg | ORAL_TABLET | Freq: Two times a day (BID) | ORAL | Status: DC

## 2012-11-14 NOTE — ED Notes (Addendum)
Abdominal pain and diarrhea on and off today. States she feels like she is having a panic attack. Did not take the medications she was Rx last week. Had a positive pregnancy test. C.o spotting.

## 2012-11-14 NOTE — ED Provider Notes (Signed)
CSN: [id number]     Arrival date & time 11/14/12  2024 History     First MD Initiated Contact with Patient 11/14/12 2036     Chief Complaint  Patient presents with  . Abdominal Pain  . Diarrhea   (Consider location/radiation/quality/duration/timing/severity/associated sxs/prior Treatment) HPI Comments: Patient is a 22 year old G1P0 female who presents with abdominal pain for the past week. Patient had a positive pregnancy test 1 week ago. The pain is located in her lower abdomen and does not radiate. The pain is described as cramping and moderate. The pain started gradually and progressively worsened since the onset. No alleviating/aggravating factors. The patient has tried nothing for symptoms without relief. Associated symptoms include vaginal spotting. Patient denies fever, headache, NVD, chest pain, SOB, dysuria, constipation.    History reviewed. No pertinent past medical history. History reviewed. No pertinent past surgical history. No family history on file. History  Substance Use Topics  . Smoking status: Never Smoker   . Smokeless tobacco: Not on file  . Alcohol Use: No   OB History   Grav Para Term Preterm Abortions TAB SAB Ect Mult Living                 Review of Systems  Gastrointestinal: Positive for abdominal pain.  Genitourinary: Positive for vaginal bleeding.  All other systems reviewed and are negative.    Allergies  Review of patient's allergies indicates no known allergies.  Home Medications   Current Outpatient Rx  Name  Route  Sig  Dispense  Refill  . cephALEXin (KEFLEX) 500 MG capsule   Oral   Take 1 capsule (500 mg total) by mouth 4 (four) times daily.   28 capsule   0   . EXPIRED: desloratadine (CLARINEX) 5 MG tablet   Oral   Take 1 tablet (5 mg total) by mouth daily.   7 tablet   0   . diphenhydrAMINE (BENADRYL) 25 MG tablet   Oral   Take 50 mg by mouth once.         Marland Kitchen DM-Phenylephrine-Acetaminophen (VICKS DAYQUIL COLD & FLU)  10-5-325 MG CAPS   Oral   Take 2 capsules by mouth every 4 (four) hours as needed. For congestion         . HYDROcodone-acetaminophen (NORCO/VICODIN) 5-325 MG per tablet      Take 1-2 tablets every 6 hours as needed for severe pain   8 tablet   0   . metroNIDAZOLE (FLAGYL) 500 MG tablet   Oral   Take 1 tablet (500 mg total) by mouth 2 (two) times daily.   14 tablet   0   . naproxen (NAPROSYN) 500 MG tablet   Oral   Take 1 tablet (500 mg total) by mouth 2 (two) times daily.   20 tablet   0   . nitrofurantoin, macrocrystal-monohydrate, (MACROBID) 100 MG capsule   Oral   Take 1 capsule (100 mg total) by mouth 2 (two) times daily.   10 capsule   0   . phenol (CHLORASEPTIC) 1.4 % LIQD   Mouth/Throat   Use as directed 2 sprays in the mouth or throat as needed. For sore throat          BP 100/38  Pulse 73  Temp(Src) 97.7 F (36.5 C) (Oral)  Resp 20  Ht 5' (1.524 m)  Wt 148 lb (67.132 kg)  BMI 28.9 kg/m2  SpO2 100%  LMP 09/15/2012 Physical Exam  Nursing note and vitals reviewed. Constitutional:  She is oriented to person, place, and time. She appears well-developed and well-nourished. No distress.  HENT:  Head: Normocephalic and atraumatic.  Eyes: Conjunctivae and EOM are normal.  Neck: Normal range of motion.  Cardiovascular: Normal rate and regular rhythm.  Exam reveals no gallop and no friction rub.   No murmur heard. Pulmonary/Chest: Effort normal and breath sounds normal. She has no wheezes. She has no rales. She exhibits no tenderness.  Abdominal: Soft. She exhibits no distension. There is tenderness. There is no rebound and no guarding.  Generalized lower abdominal tenderness to palpation that does not localize to one side. No peritoneal signs.   Musculoskeletal: Normal range of motion.  Neurological: She is alert and oriented to person, place, and time. Coordination normal.  Speech is goal-oriented. Moves limbs without ataxia.   Skin: Skin is warm and dry.   Psychiatric: She has a normal mood and affect. Her behavior is normal.    ED Course   Procedures (including critical care time)  Labs Reviewed  WET PREP, GENITAL - Abnormal; Notable for the following:    Clue Cells Wet Prep HPF POC MODERATE (*)    WBC, Wet Prep HPF POC FEW (*)    All other components within normal limits  URINALYSIS, ROUTINE W REFLEX MICROSCOPIC - Abnormal; Notable for the following:    Ketones, ur 15 (*)    Leukocytes, UA SMALL (*)    All other components within normal limits  PREGNANCY, URINE - Abnormal; Notable for the following:    Preg Test, Ur POSITIVE (*)    All other components within normal limits  CBC WITH DIFFERENTIAL - Abnormal; Notable for the following:    HCT 34.8 (*)    All other components within normal limits  HCG, QUANTITATIVE, PREGNANCY - Abnormal; Notable for the following:    hCG, Beta Chain, Sharene Butters, S 21997 (*)    All other components within normal limits  URINE MICROSCOPIC-ADD ON - Abnormal; Notable for the following:    Bacteria, UA FEW (*)    All other components within normal limits  URINE CULTURE  GC/CHLAMYDIA PROBE AMP  BASIC METABOLIC PANEL   No results found. No diagnosis found.  MDM  8:37 PM Labs and urinalysis pending. Patient will have lomotil for diarrhea. Vitals stable and patient afebrile.   9:15 PM Urine pregnancy test positive which the patient was previously aware of. Hcg and basic labs pending. Patient will have pelvic exam.  9:45 PM Patient's hcg 21,997. Patient will have OB limited US to rule out ectopic pregnancy. Wet prep shows moderate clue cells. Patient signed out to Dr. Judd Lien.   Emilia Beck, [place] 11/14/12 2149

## 2012-11-15 LAB — GC/CHLAMYDIA PROBE AMP
CT Probe RNA: NEGATIVE
GC Probe RNA: NEGATIVE

## 2012-11-15 NOTE — ED Provider Notes (Signed)
Medical screening examination/treatment/procedure(s) were conducted as a shared visit with non-physician practitioner(s) and myself.  I personally evaluated the patient during the encounter.  Patient presents with complaints of pelvic pain and vaginal spotting for the several days.  She is pregnant for the first time.  Positive pregnancy test last week.  On exam, the patient is afebrile and the vitals are stable.  The heart is regular rate and rhythm and the lungs are clear.  There is ttp in the lower left, right, and suprapubic regions.  Pelvic exam as per K. Szekalski's note.  The Bhcg was found to be beyond the critical value for an ultrasound.  This was performed and showed an iup with moderate sized chorionic hemorrhage.  I informed the patient of the results and she will follow up with OB.  She will be placed on vaginal rest in the meantime.  Geoffery Lyons, MD 11/15/12 [phone]

## 2012-11-16 LAB — URINE CULTURE

## 2012-11-26 ENCOUNTER — Other Ambulatory Visit: Payer: Self-pay | Admitting: Obstetrics & Gynecology

## 2012-11-26 DIAGNOSIS — O3680X0 Pregnancy with inconclusive fetal viability, not applicable or unspecified: Secondary | ICD-10-CM

## 2012-11-28 ENCOUNTER — Ambulatory Visit (INDEPENDENT_AMBULATORY_CARE_PROVIDER_SITE_OTHER): Payer: Medicaid Other

## 2012-11-28 DIAGNOSIS — O3680X0 Pregnancy with inconclusive fetal viability, not applicable or unspecified: Secondary | ICD-10-CM

## 2012-11-28 DIAGNOSIS — O43899 Other placental disorders, unspecified trimester: Secondary | ICD-10-CM

## 2012-12-03 ENCOUNTER — Encounter: Payer: Medicaid Other | Admitting: Obstetrics & Gynecology

## 2012-12-12 ENCOUNTER — Encounter (HOSPITAL_BASED_OUTPATIENT_CLINIC_OR_DEPARTMENT_OTHER): Payer: Self-pay | Admitting: *Deleted

## 2012-12-12 ENCOUNTER — Emergency Department (HOSPITAL_BASED_OUTPATIENT_CLINIC_OR_DEPARTMENT_OTHER)
Admission: EM | Admit: 2012-12-12 | Discharge: 2012-12-12 | Disposition: A | Discharge status: Home / Self Care | Payer: Medicaid Other | Attending: Emergency Medicine | Admitting: Emergency Medicine

## 2012-12-12 DIAGNOSIS — K5900 Constipation, unspecified: Secondary | ICD-10-CM | POA: Insufficient documentation

## 2012-12-12 DIAGNOSIS — O9989 Other specified diseases and conditions complicating pregnancy, childbirth and the puerperium: Secondary | ICD-10-CM | POA: Insufficient documentation

## 2012-12-12 DIAGNOSIS — Z79899 Other long term (current) drug therapy: Secondary | ICD-10-CM | POA: Insufficient documentation

## 2012-12-12 DIAGNOSIS — R109 Unspecified abdominal pain: Secondary | ICD-10-CM | POA: Insufficient documentation

## 2012-12-12 LAB — URINALYSIS, ROUTINE W REFLEX MICROSCOPIC
Bilirubin Urine: NEGATIVE
Ketones, ur: 15 mg/dL — AB
Nitrite: NEGATIVE
Protein, ur: NEGATIVE mg/dL
pH: 6.5 (ref 5.0–8.0)

## 2012-12-12 NOTE — ED Notes (Signed)
Pt states she is [redacted] weeks pregnant, states that her ob gyn gave her miralax yesterday for her constipation, but she has not had a bm x Sunday.

## 2012-12-12 NOTE — ED Notes (Signed)
Stabbing abdominal pain since yesterday. She is [redacted] weeks pregnant. No vaginal discharge. Walking and standing makes the pain worse. Pain is in the center of her pelvis. C.o headache.

## 2012-12-12 NOTE — ED Provider Notes (Signed)
Medical screening examination/treatment/procedure(s) were performed by non-physician practitioner and as supervising physician I was immediately available for consultation/collaboration.   Shanna Cisco, MD 12/12/12 2035

## 2012-12-12 NOTE — ED Provider Notes (Signed)
CSN: [id number]     Arrival date & time 12/12/12  1448 History   First MD Initiated Contact with Patient 12/12/12 1528     Chief Complaint  Patient presents with  . Abdominal Pain   (Consider location/radiation/quality/duration/timing/severity/associated sxs/prior Treatment) HPI Comments: Pt is c/o lower abdominal pain since yesterday:pt was seen by her ob yesterday and was given miralax for constipation:pt states that she was given miralax:pt states that she took one dose yesterday and it didn't work:pt states that he is continuing to have the symptoms so she decided to come in:no fever, bleeding, vaginal discharge  The history is provided by the patient. No language interpreter was used.    History reviewed. No pertinent past medical history. History reviewed. No pertinent past surgical history. History reviewed. No pertinent family history. History  Substance Use Topics  . Smoking status: Never Smoker   . Smokeless tobacco: Not on file  . Alcohol Use: No   OB History   Grav Para Term Preterm Abortions TAB SAB Ect Mult Living   1              Review of Systems  Constitutional: Negative.   Respiratory: Negative.   Cardiovascular: Negative.     Allergies  Review of patient's allergies indicates no known allergies.  Home Medications   Current Outpatient Rx  Name  Route  Sig  Dispense  Refill  . Prenatal Vit-Fe Fumarate-FA (MULTIVITAMIN-PRENATAL) 27-0.8 MG TABS tablet   Oral   Take 1 tablet by mouth daily at 12 noon.         . cephALEXin (KEFLEX) 500 MG capsule   Oral   Take 1 capsule (500 mg total) by mouth 4 (four) times daily.   28 capsule   0   . EXPIRED: desloratadine (CLARINEX) 5 MG tablet   Oral   Take 1 tablet (5 mg total) by mouth daily.   7 tablet   0   . diphenhydrAMINE (BENADRYL) 25 MG tablet   Oral   Take 50 mg by mouth once.         Marland Kitchen DM-Phenylephrine-Acetaminophen (VICKS DAYQUIL COLD & FLU) 10-5-325 MG CAPS   Oral   Take 2 capsules by  mouth every 4 (four) hours as needed. For congestion         . HYDROcodone-acetaminophen (NORCO/VICODIN) 5-325 MG per tablet      Take 1-2 tablets every 6 hours as needed for severe pain   8 tablet   0   . metroNIDAZOLE (FLAGYL) 500 MG tablet   Oral   Take 1 tablet (500 mg total) by mouth 2 (two) times daily.   14 tablet   0   . metroNIDAZOLE (FLAGYL) 500 MG tablet   Oral   Take 1 tablet (500 mg total) by mouth 2 (two) times daily. One po bid x 7 days   14 tablet   0   . naproxen (NAPROSYN) 500 MG tablet   Oral   Take 1 tablet (500 mg total) by mouth 2 (two) times daily.   20 tablet   0   . nitrofurantoin, macrocrystal-monohydrate, (MACROBID) 100 MG capsule   Oral   Take 1 capsule (100 mg total) by mouth 2 (two) times daily.   10 capsule   0   . phenol (CHLORASEPTIC) 1.4 % LIQD   Mouth/Throat   Use as directed 2 sprays in the mouth or throat as needed. For sore throat          BP 108/58  Pulse 74  Temp(Src) 97.9 F (36.6 C) (Oral)  Resp 20  Ht 5' (1.524 m)  Wt 138 lb (62.596 kg)  BMI 26.95 kg/m2  SpO2 99%  LMP 09/15/2012 Physical Exam  Nursing note and vitals reviewed. Constitutional: She is oriented to person, place, and time. She appears well-developed and well-nourished.  Cardiovascular: Normal rate and regular rhythm.   Pulmonary/Chest: Effort normal and breath sounds normal.  Abdominal: Soft. Bowel sounds are normal. There is no tenderness.  Neurological: She is alert and oriented to person, place, and time.  Skin: Skin is warm.  Psychiatric: She has a normal mood and affect.    ED Course  Procedures (including critical care time) Labs Review Labs Reviewed  URINALYSIS, ROUTINE W REFLEX MICROSCOPIC - Abnormal; Notable for the following:    Color, Urine AMBER (*)    APPearance CLOUDY (*)    Specific Gravity, Urine 1.031 (*)    Ketones, ur 15 (*)    All other components within normal limits  PREGNANCY, URINE - Abnormal; Notable for the  following:    Preg Test, Ur POSITIVE (*)    All other components within normal limits   Imaging Review No results found.  MDM   1. Constipation in pregnancy, first trimester    Pt has had a ultrasound to confirm placement:urine is negative:pt is okay to follow up with ob:pt instructed on using miralax    Teressa Lower, NP 12/12/12 1826

## 2012-12-29 ENCOUNTER — Encounter (HOSPITAL_COMMUNITY): Payer: Self-pay | Admitting: *Deleted

## 2012-12-29 ENCOUNTER — Inpatient Hospital Stay (HOSPITAL_COMMUNITY)
Admission: AD | Admit: 2012-12-29 | Discharge: 2012-12-29 | Disposition: A | Discharge status: Home / Self Care | Payer: Medicaid Other | Source: Ambulatory Visit | Attending: Obstetrics & Gynecology | Admitting: Obstetrics & Gynecology

## 2012-12-29 ENCOUNTER — Inpatient Hospital Stay (HOSPITAL_COMMUNITY): Payer: Medicaid Other

## 2012-12-29 DIAGNOSIS — O036 Delayed or excessive hemorrhage following complete or unspecified spontaneous abortion: Secondary | ICD-10-CM | POA: Insufficient documentation

## 2012-12-29 DIAGNOSIS — O039 Complete or unspecified spontaneous abortion without complication: Secondary | ICD-10-CM

## 2012-12-29 HISTORY — DX: Other specified health status: Z78.9

## 2012-12-29 LAB — CBC
MCH: 29.6 pg (ref 26.0–34.0)
MCHC: 35.4 g/dL (ref 30.0–36.0)
MCV: 83.6 fL (ref 78.0–100.0)
Platelets: 289 10*3/uL (ref 150–400)
RBC: 3.72 MIL/uL — ABNORMAL LOW (ref 3.87–5.11)

## 2012-12-29 MED ORDER — MISOPROSTOL 200 MCG PO TABS
800.0000 ug | ORAL_TABLET | Freq: Once | ORAL | Status: AC
  Administered 2012-12-29: 800 ug via ORAL
  Filled 2012-12-29: qty 4

## 2012-12-29 MED ORDER — HYDROMORPHONE HCL PF 1 MG/ML IJ SOLN
1.0000 mg | Freq: Once | INTRAMUSCULAR | Status: AC
  Administered 2012-12-29: 1 mg via INTRAMUSCULAR
  Filled 2012-12-29: qty 1

## 2012-12-29 MED ORDER — ONDANSETRON 8 MG PO TBDP: 8.0000 mg | ORAL_TABLET | Freq: Once | ORAL | Status: DC

## 2012-12-29 MED ORDER — ONDANSETRON 8 MG PO TBDP
8.0000 mg | ORAL_TABLET | Freq: Once | ORAL | Status: AC
  Administered 2012-12-29: 8 mg via ORAL
  Filled 2012-12-29: qty 1

## 2012-12-29 MED ORDER — PROMETHAZINE HCL 25 MG PO TABS: 25.0000 mg | ORAL_TABLET | Freq: Four times a day (QID) | ORAL | Status: DC | PRN

## 2012-12-29 MED ORDER — ACETAMINOPHEN-CODEINE #3 300-30 MG PO TABS: 1.0000 | ORAL_TABLET | Freq: Four times a day (QID) | ORAL | Status: DC | PRN

## 2012-12-29 MED ORDER — KETOROLAC TROMETHAMINE 60 MG/2ML IM SOLN
60.0000 mg | Freq: Once | INTRAMUSCULAR | Status: AC
  Administered 2012-12-29: 60 mg via INTRAMUSCULAR
  Filled 2012-12-29: qty 2

## 2012-12-29 MED ORDER — HYDROMORPHONE HCL PF 1 MG/ML IJ SOLN
1.0000 mg | Freq: Once | INTRAMUSCULAR | Status: AC
  Administered 2012-12-29: 1 mg via INTRAMUSCULAR
  Filled 2012-12-29 (×2): qty 1

## 2012-12-29 NOTE — MAU Provider Note (Signed)
History     CSN: [id number]  Arrival date and time: 12/29/12 4098   None     Chief Complaint  Patient presents with  . Miscarriage   HPI Comments: Lindsey Beard 22 y.o. G1P0 presents to MAU for bleeding following SAB and Cytotec in [facility]. She is in excessive pain 8-9/ 10.  After pain medications she feels she can tolerate examination.        Past Medical History  Diagnosis Date  . Medical history non-contributory     Past Surgical History  Procedure Laterality Date  . No past surgeries      History reviewed. No pertinent family history.  History  Substance Use Topics  . Smoking status: Never Smoker   . Smokeless tobacco: Not on file  . Alcohol Use: No    Allergies: No Known Allergies  Prescriptions prior to admission  Medication Sig Dispense Refill  . CEPHALEXIN PO Take 1 tablet by mouth 2 (two) times daily.      . Prenatal Vit-Fe Fumarate-FA (PRENATAL MULTIVITAMIN) TABS tablet Take 1 tablet by mouth daily at 12 noon.        Review of Systems  Constitutional: Negative for fever, chills and malaise/fatigue.       In pain  HENT: Negative.   Eyes: Negative.   Respiratory: Negative.   Cardiovascular: Negative.   Gastrointestinal: Negative.   Genitourinary: Negative.        Vaginal bleeding and passing blood clots and possibly products of conception  Musculoskeletal: Negative.   Skin: Negative.   Neurological: Positive for dizziness.  Psychiatric/Behavioral: Negative.    Physical Exam   Blood pressure 119/65, pulse 84, temperature 97.9 F (36.6 C), temperature source Oral, resp. rate 18, height 5' (1.524 m), weight 138 lb (62.596 kg), last menstrual period 09/15/2012.  Physical Exam  Constitutional: She is oriented to person, place, and time. She appears well-developed and well-nourished. She appears distressed.  HENT:  Head: Normocephalic.  Eyes: Pupils are equal, round, and reactive to light.  Cardiovascular: Normal rate.   GI: Soft.  There is tenderness.  Genitourinary:  Significant blood in vault, products of conception in Os- removed with hemostat and sent to pathology  Following U/S there were POC in uterus, given Cytotec 800mg  and allowed to pass remainder  Neurological: She is alert and oriented to person, place, and time.  Skin: Skin is warm and dry.  Psychiatric: She has a normal mood and affect. Her behavior is normal. Judgment and thought content normal.   Results for orders placed during the hospital encounter of 12/29/12 (from the past 24 hour(s))  CBC     Status: Abnormal   Collection Time    12/29/12 12:15 PM      Result Value Range   WBC 9.6  4.0 - 10.5 K/uL   RBC 3.72 (*) 3.87 - 5.11 MIL/uL   Hemoglobin 11.0 (*) 12.0 - 15.0 g/dL   HCT 11.9 (*) 14.7 - 82.9 %   MCV 83.6  78.0 - 100.0 fL   MCH 29.6  26.0 - 34.0 pg   MCHC 35.4  30.0 - 36.0 g/dL   RDW 56.2  13.0 - 86.5 %   Platelets 289  150 - 400 K/uL  ABO/RH     Status: None   Collection Time    12/29/12 12:15 PM      Result Value Range   ABO/RH(D) O POS     US Ob Transvaginal  12/29/2012   *RADIOLOGY REPORT*  Clinical  Data: History of spontaneous abortion, evaluate for retained products of conception  TRANSVAGINAL OBSTETRIC US  Technique:  Transvaginal ultrasound was performed for complete evaluation of the gestation as well as the maternal uterus, adnexal regions, and pelvic cul-de-sac.  Comparison:  Prior obstetrical ultrasound 12/20/2012  Intrauterine gestational YNW:GNFA visualized Yolk sac: None visualized Embryo: None visualized  Maternal uterus/adnexae: The uterus itself is unremarkable in sonographic appearance. Endometrium is diffusely thickened measuring up to 33 mm. Vascularity is seen within the endometrium in the uterine fundus. Small amount of fluid also identified within the canal.  The right ovary is sonographically unremarkable at 3.7 x 2.2 x 1.  The left ovary is unremarkable at 2.8 x 1 no free fluid.  IMPRESSION:  Diffusely thickened  and heterogeneous endometrium measuring up to 33 mm with evidence of internal vascularity in the region of the fundus concerning for retained products of conception.   Original Report Authenticated By: Malachy Moan, M.D.     MAU Course  Procedures  MDM Dilaudid 1mg . Toradol 60 mg Repeat Dilaudid 1 mg, CBC, ABORh, U/S Call Dr Despina Hidden who advised 800 mg Cytotec po and allow pt to pass POC  Assessment and Plan   A: SAB P: Dilaudid/ Zofran/ Toradol for pain control For home Tylenol # 3 and phenergan 25 mg po Call [place] MD for follow up care    Carolynn Serve 12/29/2012, 10:04 AM

## 2012-12-29 NOTE — MAU Note (Signed)
[redacted] weeks pregnant with 1st baby.  Went for check up at ob office Dr Shawnie Pons in [place], no heartbeat.  Given 4 pills to put in the vagina to help.  Put the pills in last night around 11 pm.  This morning around 8 am started having really bad cramping and bleeding.  Think passed a sac.  In a lot of pain and having a lot bleeding

## 2012-12-31 ENCOUNTER — Emergency Department (HOSPITAL_BASED_OUTPATIENT_CLINIC_OR_DEPARTMENT_OTHER)
Admission: EM | Admit: 2012-12-31 | Discharge: 2012-12-31 | Discharge status: Against Medical Advice | Payer: Medicaid Other | Attending: Emergency Medicine | Admitting: Emergency Medicine

## 2012-12-31 ENCOUNTER — Encounter (HOSPITAL_BASED_OUTPATIENT_CLINIC_OR_DEPARTMENT_OTHER): Payer: Self-pay | Admitting: *Deleted

## 2012-12-31 DIAGNOSIS — R109 Unspecified abdominal pain: Secondary | ICD-10-CM | POA: Insufficient documentation

## 2012-12-31 DIAGNOSIS — Z79899 Other long term (current) drug therapy: Secondary | ICD-10-CM | POA: Insufficient documentation

## 2012-12-31 DIAGNOSIS — O9989 Other specified diseases and conditions complicating pregnancy, childbirth and the puerperium: Secondary | ICD-10-CM | POA: Insufficient documentation

## 2012-12-31 DIAGNOSIS — Z9119 Patient's noncompliance with other medical treatment and regimen: Secondary | ICD-10-CM

## 2012-12-31 LAB — URINALYSIS, ROUTINE W REFLEX MICROSCOPIC
Bilirubin Urine: NEGATIVE
Glucose, UA: NEGATIVE mg/dL
Ketones, ur: 15 mg/dL — AB
Leukocytes, UA: NEGATIVE
Nitrite: NEGATIVE
Protein, ur: 30 mg/dL — AB
Specific Gravity, Urine: 1.041 — ABNORMAL HIGH (ref 1.005–1.030)
Urobilinogen, UA: 1 mg/dL (ref 0.0–1.0)
pH: 6 (ref 5.0–8.0)

## 2012-12-31 LAB — URINE MICROSCOPIC-ADD ON

## 2012-12-31 NOTE — ED Notes (Signed)
Abdominal pain. Vaginal bleeding. States she was [redacted] weeks pregnant and had a miscarriage 2 days ago. Was seen at [facility].

## 2012-12-31 NOTE — ED Notes (Signed)
Pt called to be taken back to a room with no answer. Pt not found in waiting area.

## 2013-01-01 NOTE — ED Provider Notes (Signed)
CSN: [id number]     Arrival date & time 12/31/12  2014 History   First MD Initiated Contact with Patient 12/31/12 2205     Chief Complaint  Patient presents with  . Abdominal Pain   (Consider location/radiation/quality/duration/timing/severity/associated sxs/prior Treatment) HPI  Past Medical History  Diagnosis Date  . Medical history non-contributory    Past Surgical History  Procedure Laterality Date  . No past surgeries     No family history on file. History  Substance Use Topics  . Smoking status: Never Smoker   . Smokeless tobacco: Not on file  . Alcohol Use: No   OB History   Grav Para Term Preterm Abortions TAB SAB Ect Mult Living   1              Review of Systems  Allergies  Review of patient's allergies indicates no known allergies.  Home Medications   Current Outpatient Rx  Name  Route  Sig  Dispense  Refill  . acetaminophen-codeine (TYLENOL #3) 300-30 MG per tablet   Oral   Take 1-2 tablets by mouth every 6 (six) hours as needed for pain.   15 tablet   0   . Prenatal Vit-Fe Fumarate-FA (PRENATAL MULTIVITAMIN) TABS tablet   Oral   Take 1 tablet by mouth daily at 12 noon.         . promethazine (PHENERGAN) 25 MG tablet   Oral   Take 1 tablet (25 mg total) by mouth every 6 (six) hours as needed for nausea.   15 tablet   0    LMP 09/15/2012 Physical Exam  ED Course  Procedures (including critical care time) Labs Review Labs Reviewed  URINALYSIS, ROUTINE W REFLEX MICROSCOPIC - Abnormal; Notable for the following:    Color, Urine AMBER (*)    APPearance CLOUDY (*)    Specific Gravity, Urine 1.041 (*)    Hgb urine dipstick LARGE (*)    Ketones, ur 15 (*)    Protein, ur 30 (*)    All other components within normal limits  PREGNANCY, URINE - Abnormal; Notable for the following:    Preg Test, Ur POSITIVE (*)    All other components within normal limits  URINE MICROSCOPIC-ADD ON - Abnormal; Notable for the following:    Squamous Epithelial  / LPF FEW (*)    All other components within normal limits   Imaging Review No results found.  MDM   1. Left before treatment completed    Pt left before being seen by physician.   1. Left before treatment completed       Shanna Cisco, MD 01/01/13 1303

## 2013-06-30 ENCOUNTER — Emergency Department (HOSPITAL_BASED_OUTPATIENT_CLINIC_OR_DEPARTMENT_OTHER)
Admission: EM | Admit: 2013-06-30 | Discharge: 2013-06-30 | Disposition: A | Discharge status: Home / Self Care | Payer: Medicaid Other | Attending: Emergency Medicine | Admitting: Emergency Medicine

## 2013-06-30 ENCOUNTER — Encounter (HOSPITAL_BASED_OUTPATIENT_CLINIC_OR_DEPARTMENT_OTHER): Payer: Self-pay | Admitting: Emergency Medicine

## 2013-06-30 DIAGNOSIS — Z3490 Encounter for supervision of normal pregnancy, unspecified, unspecified trimester: Secondary | ICD-10-CM

## 2013-06-30 DIAGNOSIS — Z79899 Other long term (current) drug therapy: Secondary | ICD-10-CM | POA: Insufficient documentation

## 2013-06-30 DIAGNOSIS — O210 Mild hyperemesis gravidarum: Secondary | ICD-10-CM | POA: Insufficient documentation

## 2013-06-30 DIAGNOSIS — R112 Nausea with vomiting, unspecified: Secondary | ICD-10-CM

## 2013-06-30 LAB — URINALYSIS, ROUTINE W REFLEX MICROSCOPIC
BILIRUBIN URINE: NEGATIVE
GLUCOSE, UA: NEGATIVE mg/dL
HGB URINE DIPSTICK: NEGATIVE
Ketones, ur: 15 mg/dL — AB
Leukocytes, UA: NEGATIVE
Nitrite: NEGATIVE
PH: 6.5 (ref 5.0–8.0)
Protein, ur: NEGATIVE mg/dL
SPECIFIC GRAVITY, URINE: 1.03 (ref 1.005–1.030)
UROBILINOGEN UA: 0.2 mg/dL (ref 0.0–1.0)

## 2013-06-30 LAB — PREGNANCY, URINE: PREG TEST UR: POSITIVE — AB

## 2013-06-30 MED ORDER — ONDANSETRON HCL 4 MG PO TABS: 4.0000 mg | ORAL_TABLET | Freq: Four times a day (QID) | ORAL | Status: DC

## 2013-06-30 NOTE — ED Provider Notes (Signed)
CSN: [id number]     Arrival date & time 06/30/13  1732 History   First MD Initiated Contact with Patient 06/30/13 2005     Chief Complaint  Patient presents with  . Nausea  . Emesis     (Consider location/radiation/quality/duration/timing/severity/associated sxs/prior Treatment) Patient is a 23 y.o. female presenting with vomiting. The history is provided by the patient. No language interpreter was used.  Emesis Severity:  Mild Duration:  2 days Associated symptoms: no abdominal pain, no chills and no diarrhea   Associated symptoms comment:  She complains of nausea and vomiting that occurs intermittently through the day without inciting factors. No fever, pain, diarrhea. No sick contacts. She denies urinary symptoms or vaginal discharge.    Past Medical History  Diagnosis Date  . Medical history non-contributory    Past Surgical History  Procedure Laterality Date  . No past surgeries     No family history on file. History  Substance Use Topics  . Smoking status: Never Smoker   . Smokeless tobacco: Not on file  . Alcohol Use: No   OB History   Grav Para Term Preterm Abortions TAB SAB Ect Mult Living   1              Review of Systems  Constitutional: Negative for fever and chills.  Respiratory: Negative.  Negative for cough and shortness of breath.   Cardiovascular: Negative.   Gastrointestinal: Positive for nausea and vomiting. Negative for abdominal pain and diarrhea.  Genitourinary: Negative.  Negative for dysuria, flank pain, vaginal discharge and pelvic pain.  Musculoskeletal: Negative.   Skin: Negative.   Neurological: Negative.       Allergies  Review of patient's allergies indicates no known allergies.  Home Medications   Current Outpatient Rx  Name  Route  Sig  Dispense  Refill  . acetaminophen-codeine (TYLENOL #3) 300-30 MG per tablet   Oral   Take 1-2 tablets by mouth every 6 (six) hours as needed for pain.   15 tablet   0   . Prenatal Vit-Fe  Fumarate-FA (PRENATAL MULTIVITAMIN) TABS tablet   Oral   Take 1 tablet by mouth daily at 12 noon.         . promethazine (PHENERGAN) 25 MG tablet   Oral   Take 1 tablet (25 mg total) by mouth every 6 (six) hours as needed for nausea.   15 tablet   0    BP 113/56  Pulse 70  Temp(Src) 98 F (36.7 C) (Oral)  Resp 20  Ht 5' (1.524 m)  Wt 140 lb (63.504 kg)  BMI 27.34 kg/m2  SpO2 100%  LMP 04/17/2013  Breastfeeding? Unknown Physical Exam  Constitutional: She is oriented to person, place, and time. She appears well-developed and well-nourished.  HENT:  Head: Normocephalic.  Neck: Normal range of motion. Neck supple.  Cardiovascular: Normal rate and regular rhythm.   Pulmonary/Chest: Effort normal and breath sounds normal.  Abdominal: Soft. Bowel sounds are normal. There is no tenderness. There is no rebound and no guarding.  Musculoskeletal: Normal range of motion.  Neurological: She is alert and oriented to person, place, and time.  Skin: Skin is warm and dry. No rash noted.  Psychiatric: She has a normal mood and affect.    ED Course  Procedures (including critical care time) Labs Review Labs Reviewed  URINALYSIS, ROUTINE W REFLEX MICROSCOPIC - Abnormal; Notable for the following:    APPearance CLOUDY (*)    Ketones, ur 15 (*)  All other components within normal limits  PREGNANCY, URINE - Abnormal; Notable for the following:    Preg Test, Ur POSITIVE (*)    All other components within normal limits   Imaging Review No results found.   EKG Interpretation None      MDM   Final diagnoses:  None    1. Pregnant 2. Nausea with infrequent vomiting  Symptoms of nausea and sporadic vomiting likely secondary to positive pregnancy test. No abdominal pain, fever, vaginal symptoms. Stable for discharge.     Dewaine Oats, PA-C 06/30/13 2035

## 2013-06-30 NOTE — ED Notes (Signed)
N/v that comes and goes x 1 week   Denies pain

## 2013-06-30 NOTE — Discharge Instructions (Signed)
Pregnancy If you are planning on getting pregnant, it is a good idea to make a preconception appointment with your caregiver to discuss having a healthy lifestyle before getting pregnant. This includes diet, weight, exercise, taking prenatal vitamins (especially folic acid, which helps prevent brain and spinal cord defects), avoiding alcohol, smoking and illegal drugs, medical problems (diabetes, convulsions), family history of genetic problems, working conditions, and immunizations. It is better to have knowledge of these things and do something about them before getting pregnant. During your pregnancy, it is important to follow certain guidelines in order to have a healthy baby. It is very important to get good prenatal care and follow your caregiver's instructions. Prenatal care includes all the medical care you receive before your baby's birth. This helps to prevent problems during the pregnancy and childbirth. HOME CARE INSTRUCTIONS   Start your prenatal visits by the 12th week of pregnancy or earlier, if possible. At first, appointments are usually scheduled monthly. They become more frequent in the last 2 months before delivery. It is important that you keep your caregiver's appointments and follow your caregiver's instructions regarding medication use, exercise, and diet.  During pregnancy, you are providing food for you and your baby. Eat a regular, well-balanced diet. Choose foods such as meat, fish, milk and other dairy products, vegetables, fruits, whole-grain breads and cereals. Your caregiver will inform you of the ideal weight gain depending on your current height and weight. Drink lots of liquids. Try to drink 8 glasses of water a day.  Alcohol is associated with a number of birth defects including fetal alcohol syndrome. It is best to avoid alcohol completely. Smoking will cause low birth rate and prematurity. Use of alcohol and nicotine during your pregnancy also increases the chances that  your child will be chemically dependent later in their life and may contribute to SIDS (Sudden Infant Death Syndrome).  Do not use illegal drugs.  Only take prescription or over-the-counter medications that are recommended by your caregiver. Other medications can cause genetic and physical problems in the baby.  Morning sickness can often be helped by keeping soda crackers at the bedside. Eat a few before getting up in the morning.  A sexual relationship may be continued until near the end of pregnancy if there are no other problems such as early (premature) leaking of amniotic fluid from the membranes, vaginal bleeding, painful intercourse or belly (abdominal) pain.  Exercise regularly. Check with your caregiver if you are unsure of the safety of some of your exercises.  Do not use hot tubs, steam rooms or saunas. These increase the risk of fainting and hurting yourself and the baby. Swimming is OK for exercise. Get plenty of rest, including afternoon naps when possible, especially in the third trimester.  Avoid toxic odors and chemicals.  Do not wear high heels. They may cause you to lose your balance and fall.  Do not lift over 5 pounds. If you do lift anything, lift with your legs and thighs, not your back.  Avoid long trips, especially in the third trimester.  If you have to travel out of the city or state, take a copy of your medical records with you. SEEK IMMEDIATE MEDICAL CARE IF:   You develop an unexplained oral temperature above 102 F (38.9 C), or as your caregiver suggests.  You have leaking of fluid from the vagina. If leaking membranes are suspected, take your temperature and inform your caregiver of this when you call.  There is vaginal spotting  or bleeding. Notify your caregiver of the amount and how many pads are used.  You continue to feel sick to your stomach (nauseous) and have no relief from remedies suggested, or you throw up (vomit) blood or coffee ground like  materials.  You develop upper abdominal pain.  You have round ligament discomfort in the lower abdominal area. This still must be evaluated by your caregiver.  You feel contractions of the uterus.  You do not feel the baby move, or there is less movement than before.  You have painful urination.  You have abnormal vaginal discharge.  You have persistent diarrhea.  You get a severe headache.  You have problems with your vision.  You develop muscle weakness.  You feel dizzy and faint.  You develop shortness of breath.  You develop chest pain.  You have back pain that travels down to your leg and feet.  You feel irregular or a very fast heartbeat.  You develop excessive weight gain in a short period of time (5 pounds in 3 to 5 days).  You are involved in a domestic violence situation. Document Released: 04/03/2005 Document Revised: 10/03/2011 Document Reviewed: 09/25/2008 [facility] Patient Information 2014 Bureau. Nausea and Vomiting Nausea is a sick feeling that often comes before throwing up (vomiting). Vomiting is a reflex where stomach contents come out of your mouth. Vomiting can cause severe loss of body fluids (dehydration). Children and elderly adults can become dehydrated quickly, especially if they also have diarrhea. Nausea and vomiting are symptoms of a condition or disease. It is important to find the cause of your symptoms. CAUSES   Direct irritation of the stomach lining. This irritation can result from increased acid production (gastroesophageal reflux disease), infection, food poisoning, taking certain medicines (such as nonsteroidal anti-inflammatory drugs), alcohol use, or tobacco use.  Signals from the brain.These signals could be caused by a headache, heat exposure, an inner ear disturbance, increased pressure in the brain from injury, infection, a tumor, or a concussion, pain, emotional stimulus, or metabolic problems.  An obstruction in the  gastrointestinal tract (bowel obstruction).  Illnesses such as diabetes, hepatitis, gallbladder problems, appendicitis, kidney problems, cancer, sepsis, atypical symptoms of a heart attack, or eating disorders.  Medical treatments such as chemotherapy and radiation.  Receiving medicine that makes you sleep (general anesthetic) during surgery. DIAGNOSIS Your caregiver may ask for tests to be done if the problems do not improve after a few days. Tests may also be done if symptoms are severe or if the reason for the nausea and vomiting is not clear. Tests may include:  Urine tests.  Blood tests.  Stool tests.  Cultures (to look for evidence of infection).  X-rays or other imaging studies. Test results can help your caregiver make decisions about treatment or the need for additional tests. TREATMENT You need to stay well hydrated. Drink frequently but in small amounts.You may wish to drink water, sports drinks, clear broth, or eat frozen ice pops or gelatin dessert to help stay hydrated.When you eat, eating slowly may help prevent nausea.There are also some antinausea medicines that may help prevent nausea. HOME CARE INSTRUCTIONS   Take all medicine as directed by your caregiver.  If you do not have an appetite, do not force yourself to eat. However, you must continue to drink fluids.  If you have an appetite, eat a normal diet unless your caregiver tells you differently.  Eat a variety of complex carbohydrates (rice, wheat, potatoes, bread), lean meats, yogurt, fruits,  and vegetables.  Avoid high-fat foods because they are more difficult to digest.  Drink enough water and fluids to keep your urine clear or pale yellow.  If you are dehydrated, ask your caregiver for specific rehydration instructions. Signs of dehydration may include:  Severe thirst.  Dry lips and mouth.  Dizziness.  Dark urine.  Decreasing urine frequency and amount.  Confusion.  Rapid breathing or  pulse. SEEK IMMEDIATE MEDICAL CARE IF:   You have blood or brown flecks (like coffee grounds) in your vomit.  You have black or bloody stools.  You have a severe headache or stiff neck.  You are confused.  You have severe abdominal pain.  You have chest pain or trouble breathing.  You do not urinate at least once every 8 hours.  You develop cold or clammy skin.  You continue to vomit for longer than 24 to 48 hours.  You have a fever. MAKE SURE YOU:   Understand these instructions.  Will watch your condition.  Will get help right away if you are not doing well or get worse. Document Released: 04/03/2005 Document Revised: 06/26/2011 Document Reviewed: 08/31/2010 [facility] Patient Information 2014 [place], [place].

## 2013-06-30 NOTE — ED Notes (Signed)
Nausea and vomiting. Sore throat.

## 2013-07-02 NOTE — ED Provider Notes (Signed)
Medical screening examination/treatment/procedure(s) were performed by non-physician practitioner and as supervising physician I was immediately available for consultation/collaboration.  Neta Ehlers, MD 07/02/13 1201

## 2013-07-02 NOTE — ED Notes (Signed)
[facility] called and sts medicaid will not pay for zofran. Dr Dina Rich reviewed chart and approved prescription for phenergan 12.5mg 

## 2013-07-11 LAB — OB RESULTS CONSOLE GC/CHLAMYDIA
CHLAMYDIA, DNA PROBE: NEGATIVE
Gonorrhea: NEGATIVE

## 2013-07-11 LAB — OB RESULTS CONSOLE RPR: RPR: NONREACTIVE

## 2013-07-11 LAB — OB RESULTS CONSOLE RUBELLA ANTIBODY, IGM: Rubella: IMMUNE

## 2013-07-11 LAB — OB RESULTS CONSOLE ABO/RH: RH Type: POSITIVE

## 2013-07-11 LAB — OB RESULTS CONSOLE HIV ANTIBODY (ROUTINE TESTING): HIV: NONREACTIVE

## 2013-07-11 LAB — OB RESULTS CONSOLE HEPATITIS B SURFACE ANTIGEN: HEP B S AG: NEGATIVE

## 2013-08-06 ENCOUNTER — Encounter: Payer: Medicaid Other | Admitting: Advanced Practice Midwife

## 2013-08-06 ENCOUNTER — Encounter: Payer: Self-pay | Admitting: Obstetrics & Gynecology

## 2013-10-13 ENCOUNTER — Encounter (HOSPITAL_COMMUNITY): Payer: Self-pay | Admitting: Emergency Medicine

## 2013-10-13 ENCOUNTER — Emergency Department (HOSPITAL_COMMUNITY)
Admission: EM | Admit: 2013-10-13 | Discharge: 2013-10-13 | Disposition: A | Discharge status: Home / Self Care | Payer: Medicaid Other | Attending: Emergency Medicine | Admitting: Emergency Medicine

## 2013-10-13 DIAGNOSIS — O9989 Other specified diseases and conditions complicating pregnancy, childbirth and the puerperium: Secondary | ICD-10-CM | POA: Insufficient documentation

## 2013-10-13 DIAGNOSIS — K602 Anal fissure, unspecified: Secondary | ICD-10-CM

## 2013-10-13 HISTORY — DX: Encounter for supervision of normal pregnancy, unspecified, unspecified trimester: Z34.90

## 2013-10-13 MED ORDER — DOCUSATE SODIUM 100 MG PO CAPS: 100.0000 mg | ORAL_CAPSULE | Freq: Two times a day (BID) | ORAL | Status: DC

## 2013-10-13 NOTE — Discharge Instructions (Signed)
No sign of uterine contractions on your evaluation/exam today. Your bleeding appears to come from a small tear called an anal fissure. If you redevelop any worsening symptoms, or any bleeding that is clearly from your vagina, please recheck at [facility] hospital.   Anal Fissure, Adult An anal fissure is a small tear or crack in the skin around the anus. Bleeding from a fissure usually stops on its own within a few minutes. However, bleeding will often reoccur with each bowel movement until the crack heals.  CAUSES   Passing large, hard stools.  Frequent diarrheal stools.  Constipation.  Inflammatory bowel disease (Crohn's disease or ulcerative colitis).  Infections.  Anal sex. SYMPTOMS   Small amounts of blood seen on your stools, on toilet paper, or in the toilet after a bowel movement.  Rectal bleeding.  Painful bowel movements.  Itching or irritation around the anus. DIAGNOSIS Your caregiver will examine the anal area. An anal fissure can usually be seen with careful inspection. A rectal exam may be performed and a short tube (anoscope) may be used to examine the anal canal. TREATMENT   You may be instructed to take fiber supplements. These supplements can soften your stool to help make bowel movements easier.  Sitz baths may be recommended to help heal the tear. Do not use soap in the sitz baths.  A medicated cream or ointment may be prescribed to lessen discomfort. HOME CARE INSTRUCTIONS   Maintain a diet high in fruits, whole grains, and vegetables. Avoid constipating foods like bananas and dairy products.  Take sitz baths as directed by your caregiver.  Drink enough fluids to keep your urine clear or pale yellow.  Only take over-the-counter or prescription medicines for pain, discomfort, or fever as directed by your caregiver. Do not take aspirin as this may increase bleeding.  Do not use ointments containing numbing medications (anesthetics) or hydrocortisone.  They could slow healing. SEEK MEDICAL CARE IF:   Your fissure is not completely healed within 3 days.  You have further bleeding.  You have a fever.  You have diarrhea mixed with blood.  You have pain.  Your problem is getting worse rather than better. MAKE SURE YOU:   Understand these instructions.  Will watch your condition.  Will get help right away if you are not doing well or get worse. Document Released: 04/03/2005 Document Revised: 06/26/2011 Document Reviewed: 09/18/2010 [facility] Patient Information 2015 [place], [place]. This information is not intended to replace advice given to you by your health care provider. Make sure you discuss any questions you have with your health care provider.

## 2013-10-13 NOTE — ED Notes (Signed)
Fetal Heart Rate-152  Before taking off monitor.

## 2013-10-13 NOTE — ED Notes (Signed)
abd pain , with cramping onset today.  Increased frequency of urination. No vag bleeding, Says she is having rectal bleeding.

## 2013-10-13 NOTE — ED Provider Notes (Signed)
CSN: [id number]     Arrival date & time 10/13/13  1933 History   First MD Initiated Contact with Patient 10/13/13 2011     Chief Complaint  Patient presents with  . Rectal Bleeding      HPI  Patient presents with concern about some rectal bleeding. Intermittent cramps in her abdomen. Is been going on for more than a week intermittent. Does occasionally see some blood when she wipes use the bathroom. She is adamant that this is not vaginal. Seems to be rectal blood. No diarrhea. No heart or firm bowel movements or constipation. She is G2 P1 currently 22 weeks with a previous first trimester spontaneous AB requiring D&C.  Past Medical History  Diagnosis Date  . Medical history non-contributory   . Pregnant    Past Surgical History  Procedure Laterality Date  . No past surgeries    . Dilitation and curretage     History reviewed. No pertinent family history. History  Substance Use Topics  . Smoking status: Never Smoker   . Smokeless tobacco: Not on file  . Alcohol Use: No   OB History   Grav Para Term Preterm Abortions TAB SAB Ect Mult Living   2              Review of Systems  Constitutional: Negative for fever, chills, diaphoresis, appetite change and fatigue.  HENT: Negative for mouth sores, sore throat and trouble swallowing.   Eyes: Negative for visual disturbance.  Respiratory: Negative for cough, chest tightness, shortness of breath and wheezing.   Cardiovascular: Negative for chest pain.  Gastrointestinal: Positive for anal bleeding. Negative for nausea, vomiting, abdominal pain, diarrhea and abdominal distention.       Abdominal cramping  Endocrine: Negative for polydipsia, polyphagia and polyuria.  Genitourinary: Negative for dysuria, frequency and hematuria.  Musculoskeletal: Negative for gait problem.  Skin: Negative for color change, pallor and rash.  Neurological: Negative for dizziness, syncope, light-headedness and headaches.  Hematological: Does not  bruise/bleed easily.  Psychiatric/Behavioral: Negative for behavioral problems and confusion.      Allergies  Review of patient's allergies indicates no known allergies.  Home Medications   Prior to Admission medications   Medication Sig Start Date End Date Taking? Authorizing Provider  acetaminophen-codeine (TYLENOL #3) 300-30 MG per tablet Take 1-2 tablets by mouth every 6 (six) hours as needed for pain. 12/29/12   Olegario Messier, NP  docusate sodium (COLACE) 100 MG capsule Take 1 capsule (100 mg total) by mouth every 12 (twelve) hours. 10/13/13   Tanna Furry, MD  ondansetron (ZOFRAN) 4 MG tablet Take 1 tablet (4 mg total) by mouth every 6 (six) hours. 06/30/13   Dewaine Oats, PA-C  Prenatal Vit-Fe Fumarate-FA (PRENATAL MULTIVITAMIN) TABS tablet Take 1 tablet by mouth daily at 12 noon.    Historical Provider, MD  promethazine (PHENERGAN) 25 MG tablet Take 1 tablet (25 mg total) by mouth every 6 (six) hours as needed for nausea. 12/29/12   Olegario Messier, NP   BP 113/59  Pulse 85  Temp(Src) 97.9 F (36.6 C) (Oral)  Resp 24  Ht 5' (1.524 m)  Wt 162 lb 14.4 oz (73.891 kg)  BMI 31.81 kg/m2  SpO2 100%  LMP 05/18/2013  Breastfeeding? No Physical Exam  Constitutional: She is oriented to person, place, and time. She appears well-developed and well-nourished. No distress.  HENT:  Head: Normocephalic.  Eyes: Conjunctivae are normal. Pupils are equal, round, and reactive to light. No scleral icterus.  Neck: Normal range of motion. Neck supple. No thyromegaly present.  Cardiovascular: Normal rate and regular rhythm.  Exam reveals no gallop and no friction rub.   No murmur heard. Pulmonary/Chest: Effort normal and breath sounds normal. No respiratory distress. She has no wheezes. She has no rales.  Abdominal: Soft. Bowel sounds are normal. She exhibits no distension. There is no tenderness. There is no rebound.  Gravid, but otherwise benign abdomen. Nontender. Bedside limited  ultrasound shows positive fetal heart tones at 145. Singleton intrauterine pregnancy with fetal movement noted.  Musculoskeletal: Normal range of motion.  Neurological: She is alert and oriented to person, place, and time.  Skin: Skin is warm and dry. No rash noted.  Psychiatric: She has a normal mood and affect. Her behavior is normal.    ED Course  Procedures (including critical care time) Labs Review Labs Reviewed - No data to display  Imaging Review No results found.   EKG Interpretation None      MDM   Final diagnoses:  Anal fissure    Patient is placed on uterine fetal monitors. No contractions noted. Plan discharge home. Colace. OB followup with any additional symptoms. With any bleeding that is obviously vaginal she should return, or check at [facility] hospital.  Exam shows a small anal fissure.  No contractions noted with external fetal monitors.  Plan is colace, and f/u with her OB.    Tanna Furry, MD 10/13/13 2032

## 2013-10-13 NOTE — ED Notes (Signed)
Spoke to [place] at [facility] and she stated at 22 weeks, we monitor by doppler, but she would look at the tracings.

## 2013-11-03 ENCOUNTER — Encounter (HOSPITAL_COMMUNITY): Payer: Self-pay | Admitting: *Deleted

## 2013-11-24 ENCOUNTER — Encounter (HOSPITAL_COMMUNITY): Payer: Self-pay | Admitting: *Deleted

## 2013-11-24 ENCOUNTER — Observation Stay (HOSPITAL_COMMUNITY)
Admission: AD | Admit: 2013-11-24 | Discharge: 2013-11-25 | Disposition: A | Discharge status: Home / Self Care | Payer: Medicaid Other | Source: Ambulatory Visit | Attending: Obstetrics and Gynecology | Admitting: Obstetrics and Gynecology

## 2013-11-24 ENCOUNTER — Inpatient Hospital Stay (HOSPITAL_COMMUNITY): Payer: Medicaid Other

## 2013-11-24 DIAGNOSIS — N939 Abnormal uterine and vaginal bleeding, unspecified: Secondary | ICD-10-CM

## 2013-11-24 DIAGNOSIS — O4690 Antepartum hemorrhage, unspecified, unspecified trimester: Secondary | ICD-10-CM | POA: Diagnosis not present

## 2013-11-24 LAB — CBC
HEMATOCRIT: 32.4 % — AB (ref 36.0–46.0)
Hemoglobin: 10.9 g/dL — ABNORMAL LOW (ref 12.0–15.0)
MCH: 30.1 pg (ref 26.0–34.0)
MCHC: 33.6 g/dL (ref 30.0–36.0)
MCV: 89.5 fL (ref 78.0–100.0)
Platelets: 227 10*3/uL (ref 150–400)
RBC: 3.62 MIL/uL — AB (ref 3.87–5.11)
RDW: 13.1 % (ref 11.5–15.5)
WBC: 11.2 10*3/uL — AB (ref 4.0–10.5)

## 2013-11-24 LAB — TYPE AND SCREEN
ABO/RH(D): O POS
ANTIBODY SCREEN: NEGATIVE

## 2013-11-24 MED ORDER — ZOLPIDEM TARTRATE 5 MG PO TABS: 5.0000 mg | ORAL_TABLET | Freq: Every evening | ORAL | Status: DC | PRN

## 2013-11-24 MED ORDER — DOCUSATE SODIUM 100 MG PO CAPS
100.0000 mg | ORAL_CAPSULE | Freq: Every day | ORAL | Status: DC
  Filled 2013-11-24: qty 1

## 2013-11-24 MED ORDER — BETAMETHASONE SOD PHOS & ACET 6 (3-3) MG/ML IJ SUSP
12.0000 mg | INTRAMUSCULAR | Status: AC
  Administered 2013-11-24 – 2013-11-25 (×2): 12 mg via INTRAMUSCULAR
  Filled 2013-11-24 (×2): qty 2

## 2013-11-24 MED ORDER — LACTATED RINGERS IV SOLN
INTRAVENOUS | Status: DC
  Administered 2013-11-24 – 2013-11-25 (×3): via INTRAVENOUS

## 2013-11-24 MED ORDER — CALCIUM CARBONATE ANTACID 500 MG PO CHEW: 2.0000 | CHEWABLE_TABLET | ORAL | Status: DC | PRN

## 2013-11-24 MED ORDER — LACTATED RINGERS IV BOLUS (SEPSIS)
1000.0000 mL | Freq: Once | INTRAVENOUS | Status: AC
  Administered 2013-11-24: 1000 mL via INTRAVENOUS

## 2013-11-24 MED ORDER — BETAMETHASONE SOD PHOS & ACET 6 (3-3) MG/ML IJ SUSP: 12.0000 mg | Freq: Once | INTRAMUSCULAR | Status: DC

## 2013-11-24 MED ORDER — PRENATAL MULTIVITAMIN CH
1.0000 | ORAL_TABLET | Freq: Every day | ORAL | Status: DC
  Filled 2013-11-24: qty 1

## 2013-11-24 MED ORDER — ACETAMINOPHEN 325 MG PO TABS: 650.0000 mg | ORAL_TABLET | ORAL | Status: DC | PRN

## 2013-11-24 NOTE — H&P (Signed)
Lindsey Beard is a 23 y.o. female, G2P0010 at 43.5 by [facility] (27.1 weeks by EPIC), unexplained vaginal bleeding  There are no active problems to display for this patient.   Pregnancy Course: all dates below are per [facility]'s chart Patient entered care at 11.1 weeks.   EDC of 02/18/14 was established by [place] at 11.1 weeks.   Anatomy scan:  20.0 weeks, with normal findings except for a two vessel cord and an anterior placenta.    Additional [place] evaluations:  Dating [place] at 15.1weeks by LMP.  [place] GA 11.1 week Limited US for bleeding at 27.1, normal findings no previa, no abruption  Growth [place] at 27.2WNL   Significant prenatal events:  At 27.5 weeks unexplained bleeding Last evaluation: 11/21/13 at 27.2, VE no done  Reason for admission: unexplained vaginal bleeding  Pt States:     Contractions Frequency: none     Contraction severity: n/a     Fetal activity: +FM   OB History   Grav Para Term Preterm Abortions TAB SAB Ect Mult Living   2    1  1    0     Past Medical History  Diagnosis Date  . Medical history non-contributory   . Pregnant    Past Surgical History  Procedure Laterality Date  . No past surgeries    . Dilitation and curretage     Family History: family history includes Cancer in her maternal aunt. Social History:  reports that she has never smoked. She does not have any smokeless tobacco history on file. She reports that she does not drink alcohol or use illicit drugs.   Prenatal Transfer Tool  Maternal Diabetes: normal Genetic Screening: Normal Maternal Ultrasounds/Referrals: Normal Fetal Ultrasounds or other Referrals:  None Maternal Substance Abuse:  No Significant Maternal Medications:  None Significant Maternal Lab Results: None   ROS:  See HPI above, all other systems are negative  No Known Allergies     Blood pressure 131/71, pulse 83, temperature 98.3 F (36.8 C), temperature source Oral, resp. rate 18, height 5' (1.524 m), weight 171 lb (77.565  kg), last menstrual period 05/18/2013, unknown if currently breastfeeding.   Maternal Exam:  Uterine Assessment: none.  Abdomen: Gravid, non tender. Fundal height is aga.  Normal external genitalia, vulva, cervix, uterus and adnexa.  No lesions noted on exam.  Pelvis adequate for delivery. yes Fetal presentation: Cephalic by today [place] by    Fetal Exam:  Fetal Monitor Surveillance : Continuous Monitoring / Intermitting per -  Mode: Ultrasound.  FHR: Catagory  1 CTXs: none EFW: 1.5lbs  Physical Exam: Nursing note and vitals reviewed General: alert and cooperative She appears well nourished.  Psychiatric: Normal mood and affect. Her behavior is normal.  Head: Normocephalic.  Eyes: Pupils are equal, round, and reactive to light.  Neck: Normal range of motion.  Cardiovascular: RRR without murmur.  Respiratory: CTAB. Effort normal.  Abd: soft, non-tender, +BS, no rebound, no guarding  Genitourinary: Vagina normal.  Musculoskeletal: Normal range of motion.  Ext: WNL, No evidence of DVT seen on physical exam. Homan's sign negative bilaterally Minimal bilaterally non-pitting edema Neurological: A&Ox3.  Normal reflexes.  Skin: Warm and dry.    Prenatal labs: ABO, Rh: --/--/O POS (09/14 1215) Antibody:   neg Rubella:   immune RPR:   NR HBsAg:   neg HIV:   neg GBS:  unknown Sickle cell/Hgb electrophoresis:  WNL Pap:  To be done in postpartum GC:  neg Chlamydia:  neg Genetic screenings:  wnl Glucola:  wnl   Assessment IUP at 27.5 Weeks by Chesley Noon Membrane: intact GBS: unknown    Plan: Admit to Antepartum unit for 23 hour observation for unknown bleeding Routing [facility] AP orders Diet: regular BMZ CBC   Francella Barnett, CNM, MSN 11/24/2013, 7:48 PM

## 2013-11-24 NOTE — MAU Provider Note (Signed)
[place] anterior placenta, no previa, cervical length 3.42m, no sign of abruption Consulted with Dr. Cletis Media CBC BMZ course 23 hour observation   Farmer Mccahill, CNM, MSN 11/24/2013. 5:27 PM

## 2013-11-24 NOTE — MAU Note (Signed)
Pt states painless vaginal bleeding started about 1200 with multiple quarter and dimmed sized clots.  Bright red and then turned to darker red which half filled sanitary pad.  Pt came in from EMS with bloody panties but no clots. No gush of fluids.  Some FM but states she's has not been feeling much movement today.

## 2013-11-24 NOTE — Plan of Care (Signed)
Problem: Consults Goal: Birthing Suites Patient Information Press F2 to bring up selections list Outcome: Completed/Met Date Met:  11/24/13  Pt < [redacted] weeks EGA

## 2013-11-24 NOTE — MAU Provider Note (Signed)
Lindsey Beard is a 23 y.o. G2P0010 at 27.1 weeks arrived to MAU via EMS c/o VB.  Pt reports bright red vb start around noon.  She stood up and felt a gush.  She saturated 1 pad and her underwear.  She denies sex in the last 48 hours or ctx.  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  Retested on 11/21/13 NR.   History     There are no active problems to display for this patient.   No chief complaint on file.  HPI  OB History   Grav Para Term Preterm Abortions TAB SAB Ect Mult Living   2    1  1    0      Past Medical History  Diagnosis Date  . Medical history non-contributory   . Pregnant     Past Surgical History  Procedure Laterality Date  . No past surgeries    . Dilitation and curretage      History reviewed. No pertinent family history.  History  Substance Use Topics  . Smoking status: Never Smoker   . Smokeless tobacco: Not on file  . Alcohol Use: No    Allergies: No Known Allergies  Prescriptions prior to admission  Medication Sig Dispense Refill  . acetaminophen-codeine (TYLENOL #3) 300-30 MG per tablet Take 1-2 tablets by mouth every 6 (six) hours as needed for pain.  15 tablet  0  . docusate sodium (COLACE) 100 MG capsule Take 1 capsule (100 mg total) by mouth every 12 (twelve) hours.  60 capsule  0  . ondansetron (ZOFRAN) 4 MG tablet Take 1 tablet (4 mg total) by mouth every 6 (six) hours.  12 tablet  0  . Prenatal Vit-Fe Fumarate-FA (PRENATAL MULTIVITAMIN) TABS tablet Take 1 tablet by mouth daily at 12 noon.      . promethazine (PHENERGAN) 25 MG tablet Take 1 tablet (25 mg total) by mouth every 6 (six) hours as needed for nausea.  15 tablet  0    ROS See HPI above, all other systems are negative  Physical Exam   Blood pressure 131/71, pulse 83, temperature 98.3 F (36.8 C), temperature source Oral, resp. rate 18, height 5' (1.524 m), weight 171 lb (77.565 kg), last menstrual period 05/18/2013, unknown if currently breastfeeding.  Physical  Exam  ABD: Soft, non tender to palpation, no rebound or guarding SVE: deferred   ED Course  Assessment: IUP at  27.1 weeks Membranes: intact FHR: Category 1 CTX:  none   Plan: IV [place] Consult with Dr. Darci Current Fareeha Evon, CNM, MSN 11/24/2013. 1:06 PM

## 2013-11-25 DIAGNOSIS — N939 Abnormal uterine and vaginal bleeding, unspecified: Secondary | ICD-10-CM | POA: Diagnosis present

## 2013-11-25 MED ORDER — ACETAMINOPHEN 325 MG PO TABS: 650.0000 mg | ORAL_TABLET | ORAL | Status: DC | PRN

## 2013-11-25 MED ORDER — CALCIUM CARBONATE ANTACID 500 MG PO CHEW: 2.0000 | CHEWABLE_TABLET | ORAL | Status: DC | PRN

## 2013-11-25 MED ORDER — FERROUS SULFATE 325 (65 FE) MG PO TABS
325.0000 mg | ORAL_TABLET | Freq: Two times a day (BID) | ORAL | Status: DC
  Administered 2013-11-25 (×2): 325 mg via ORAL
  Filled 2013-11-25 (×2): qty 1

## 2013-11-25 MED ORDER — ZOLPIDEM TARTRATE 5 MG PO TABS: 5.0000 mg | ORAL_TABLET | Freq: Every evening | ORAL | Status: DC | PRN

## 2013-11-25 MED ORDER — DOCUSATE SODIUM 100 MG PO CAPS
100.0000 mg | ORAL_CAPSULE | Freq: Every day | ORAL | Status: DC
  Administered 2013-11-25: 100 mg via ORAL

## 2013-11-25 MED ORDER — FERROUS SULFATE 325 (65 FE) MG PO TABS: 325.0000 mg | ORAL_TABLET | Freq: Two times a day (BID) | ORAL | Status: DC

## 2013-11-25 MED ORDER — PRENATAL MULTIVITAMIN CH
1.0000 | ORAL_TABLET | Freq: Every day | ORAL | Status: DC
  Administered 2013-11-25: 1 via ORAL

## 2013-11-25 NOTE — Progress Notes (Signed)
[id number]  NST Reviewed: Reactive  135 bpm, Mod Var, -Decels, +Accels UC: None Graphed   Cat I FT  Keefer Soulliere LYNN, CNM

## 2013-11-25 NOTE — Discharge Instructions (Signed)
Vaginal Bleeding During Pregnancy, Third Trimester A small amount of bleeding (spotting) from the vagina is relatively common in pregnancy. Various things can cause bleeding or spotting in pregnancy. Sometimes the bleeding is normal and is not a problem. However, bleeding during the third trimester can also be a sign of something serious for the mother and the baby. Be sure to tell your health care provider about any vaginal bleeding right away.  Some possible causes of vaginal bleeding during the third trimester include:   The placenta may be partially or completely covering the opening to the cervix (placenta previa).   The placenta may have separated from the uterus (abruption of the placenta).   There may be an infection or growth on the cervix.   You may be starting labor, called discharging of the mucus plug.   The placenta may grow into the muscle layer of the uterus (placenta accreta).  HOME CARE INSTRUCTIONS  Watch your condition for any changes. The following actions may help to lessen any discomfort you are feeling:   Follow your health care provider's instructions for limiting your activity. If your health care provider orders bed rest, you may need to stay in bed and only get up to use the bathroom. However, your health care provider may allow you to continue light activity.  If needed, make plans for someone to help with your regular activities and responsibilities while you are on bed rest.  Keep track of the number of pads you use each day, how often you change pads, and how soaked (saturated) they are. Write this down.  Do not use tampons. Do not douche.  Do not have sexual intercourse or orgasms until approved by your health care provider.  Follow your health care provider's advice about lifting, driving, and physical activities.  If you pass any tissue from your vagina, save the tissue so you can show it to your health care provider.   Only take over-the-counter  or prescription medicines as directed by your health care provider.  Do not take aspirin because it can make you bleed.   Keep all follow-up appointments as directed by your health care provider. SEEK MEDICAL CARE IF:  You have any vaginal bleeding during any part of your pregnancy.  You have cramps or labor pains.  You have a fever, not controlled by medicine. SEEK IMMEDIATE MEDICAL CARE IF:   You have severe cramps or pain in your back or belly (abdomen).  You have chills.  You have a gush of fluid from the vagina.  You pass large clots or tissue from your vagina.  Your bleeding increases.  You feel light-headed or weak.  You pass out.  You feel less movement or no movement of the baby.  MAKE SURE YOU:  Understand these instructions.  Will watch your condition.  Will get help right away if you are not doing well or get worse. Document Released: 06/24/2002 Document Revised: 04/08/2013 Document Reviewed: 12/09/2012 [facility] Patient Information 2015 [place], [place]. This information is not intended to replace advice given to you by your health care provider. Make sure you discuss any questions you have with your health care provider. Fetal Movement Counts Patient Name: __________________________________________________ Patient Due Date: ____________________ Performing a fetal movement count is highly recommended in high-risk pregnancies, but it is good for every pregnant woman to do. Your health care provider may ask you to start counting fetal movements at 28 weeks of the pregnancy. Fetal movements often increase:  After eating a  full meal.  After physical activity.  After eating or drinking something sweet or cold.  At rest. Pay attention to when you feel the baby is most active. This will help you notice a pattern of your baby's sleep and wake cycles and what factors contribute to an increase in fetal movement. It is important to perform a fetal movement count at  the same time each day when your baby is normally most active.  HOW TO COUNT FETAL MOVEMENTS 1. Find a quiet and comfortable area to sit or lie down on your left side. Lying on your left side provides the best blood and oxygen circulation to your baby. 2. Write down the day and time on a sheet of paper or in a journal. 3. Start counting kicks, flutters, swishes, rolls, or jabs in a 2-hour period. You should feel at least 10 movements within 2 hours. 4. If you do not feel 10 movements in 2 hours, wait 2-3 hours and count again. Look for a change in the pattern or not enough counts in 2 hours. SEEK MEDICAL CARE IF:  You feel less than 10 counts in 2 hours, tried twice.  There is no movement in over an hour.  The pattern is changing or taking longer each day to reach 10 counts in 2 hours.  You feel the baby is not moving as he or she usually does. Date: ____________ Movements: ____________ Start time: ____________ Lindsey Beard time: ____________  Date: ____________ Movements: ____________ Start time: ____________ Lindsey Beard time: ____________ Date: ____________ Movements: ____________ Start time: ____________ Lindsey Beard time: ____________ Date: ____________ Movements: ____________ Start time: ____________ Lindsey Beard time: ____________ Date: ____________ Movements: ____________ Start time: ____________ Lindsey Beard time: ____________ Date: ____________ Movements: ____________ Start time: ____________ Lindsey Beard time: ____________ Date: ____________ Movements: ____________ Start time: ____________ Lindsey Beard time: ____________ Date: ____________ Movements: ____________ Start time: ____________ Lindsey Beard time: ____________  Date: ____________ Movements: ____________ Start time: ____________ Lindsey Beard time: ____________ Date: ____________ Movements: ____________ Start time: ____________ Lindsey Beard time: ____________ Date: ____________ Movements: ____________ Start time: ____________ Lindsey Beard time: ____________ Date: ____________ Movements:  ____________ Start time: ____________ Lindsey Beard time: ____________ Date: ____________ Movements: ____________ Start time: ____________ Lindsey Beard time: ____________ Date: ____________ Movements: ____________ Start time: ____________ Lindsey Beard time: ____________ Date: ____________ Movements: ____________ Start time: ____________ Lindsey Beard time: ____________  Date: ____________ Movements: ____________ Start time: ____________ Lindsey Beard time: ____________ Date: ____________ Movements: ____________ Start time: ____________ Lindsey Beard time: ____________ Date: ____________ Movements: ____________ Start time: ____________ Lindsey Beard time: ____________ Date: ____________ Movements: ____________ Start time: ____________ Lindsey Beard time: ____________ Date: ____________ Movements: ____________ Start time: ____________ Lindsey Beard time: ____________ Date: ____________ Movements: ____________ Start time: ____________ Lindsey Beard time: ____________ Date: ____________ Movements: ____________ Start time: ____________ Lindsey Beard time: ____________  Date: ____________ Movements: ____________ Start time: ____________ Lindsey Beard time: ____________ Date: ____________ Movements: ____________ Start time: ____________ Lindsey Beard time: ____________ Date: ____________ Movements: ____________ Start time: ____________ Lindsey Beard time: ____________ Date: ____________ Movements: ____________ Start time: ____________ Lindsey Beard time: ____________ Date: ____________ Movements: ____________ Start time: ____________ Lindsey Beard time: ____________ Date: ____________ Movements: ____________ Start time: ____________ Lindsey Beard time: ____________ Date: ____________ Movements: ____________ Start time: ____________ Lindsey Beard time: ____________  Date: ____________ Movements: ____________ Start time: ____________ Lindsey Beard time: ____________ Date: ____________ Movements: ____________ Start time: ____________ Lindsey Beard time: ____________ Date: ____________ Movements: ____________ Start time: ____________ Lindsey Beard  time: ____________ Date: ____________ Movements: ____________ Start time: ____________ Lindsey Beard time: ____________ Date: ____________ Movements: ____________ Start time: ____________ Lindsey Beard time: ____________ Date: ____________ Movements: ____________ Start time: ____________ Lindsey Beard time: ____________ Date: ____________ Movements: ____________  Start time: ____________ Lindsey Beard time: ____________  Date: ____________ Movements: ____________ Start time: ____________ Lindsey Beard time: ____________ Date: ____________ Movements: ____________ Start time: ____________ Lindsey Beard time: ____________ Date: ____________ Movements: ____________ Start time: ____________ Lindsey Beard time: ____________ Date: ____________ Movements: ____________ Start time: ____________ Lindsey Beard time: ____________ Date: ____________ Movements: ____________ Start time: ____________ Lindsey Beard time: ____________ Date: ____________ Movements: ____________ Start time: ____________ Lindsey Beard time: ____________ Date: ____________ Movements: ____________ Start time: ____________ Lindsey Beard time: ____________  Date: ____________ Movements: ____________ Start time: ____________ Lindsey Beard time: ____________ Date: ____________ Movements: ____________ Start time: ____________ Lindsey Beard time: ____________ Date: ____________ Movements: ____________ Start time: ____________ Lindsey Beard time: ____________ Date: ____________ Movements: ____________ Start time: ____________ Lindsey Beard time: ____________ Date: ____________ Movements: ____________ Start time: ____________ Lindsey Beard time: ____________ Date: ____________ Movements: ____________ Start time: ____________ Lindsey Beard time: ____________ Date: ____________ Movements: ____________ Start time: ____________ Lindsey Beard time: ____________  Date: ____________ Movements: ____________ Start time: ____________ Lindsey Beard time: ____________ Date: ____________ Movements: ____________ Start time: ____________ Lindsey Beard time: ____________ Date: ____________  Movements: ____________ Start time: ____________ Lindsey Beard time: ____________ Date: ____________ Movements: ____________ Start time: ____________ Lindsey Beard time: ____________ Date: ____________ Movements: ____________ Start time: ____________ Lindsey Beard time: ____________ Date: ____________ Movements: ____________ Start time: ____________ Lindsey Beard time: ____________ Document Released: 05/03/2006 Document Revised: 08/18/2013 Document Reviewed: 01/29/2012 [employer] Patient Information 2015 [employer]. This information is not intended to replace advice given to you by your health care provider. Make sure you discuss any questions you have with your health care provider.

## 2013-11-25 NOTE — Progress Notes (Addendum)
Hospital day # 1 pregnancy at [redacted]w[redacted]d by [facility]--unexplained vaginal bleeding  S:        Perception of contractions: none      Vaginal bleeding: none now       Vaginal discharge:  no significant change  O: BP 110/50  Pulse 80  Temp(Src) 98.7 F (37.1 C) (Oral)  Resp 16  Ht 5' (1.524 m)  Wt 171 lb (77.565 kg)  BMI 33.40 kg/m2  LMP 05/18/2013      Fetal tracings: reactive, reassuring        Contractions: none      Uterus gravid, consistent with 27.5 weeks and non-tender      Extremities: extremities normal, atraumatic, no cyanosis or edema and no significant edema and no signs of DVT          Recent Results (from the past 2160 hour(s))  CBC     Status: Abnormal   Collection Time    11/24/13  6:44 PM      Result Value Ref Range   WBC 11.2 (*) 4.0 - 10.5 K/uL   RBC 3.62 (*) 3.87 - 5.11 MIL/uL   Hemoglobin 10.9 (*) 12.0 - 15.0 g/dL   HCT 32.4 (*) 36.0 - 46.0 %   MCV 89.5  78.0 - 100.0 fL   MCH 30.1  26.0 - 34.0 pg   MCHC 33.6  30.0 - 36.0 g/dL   RDW 13.1  11.5 - 15.5 %   Platelets 227  150 - 400 K/uL  TYPE AND SCREEN     Status: None   Collection Time    11/24/13  8:20 PM      Result Value Ref Range   ABO/RH(D) O POS     Antibody Screen NEG     Sample Expiration 11/27/2013           Meds: BMZ #1, Iron supplement  A: [redacted]w[redacted]d with unexplained bleeding- resolved      Fetal tracings:reassuring and reactive     Contractions: none     Uterus non-tender      Extremities: DTR 1+, no clonus, no edema   P: Continue current plan of care      Upcoming tests/treatments: BMZ #2      Assess for bleeding prior to discharge      Discharge later today after BMZ complete      Iron supplement       PT to FU in one week at the office      MDs will follow    Lindsey Beard, CNM, MSN 11/25/2013. 9:59 AM

## 2014-01-15 NOTE — Discharge Summary (Signed)
Hospital day # 1 pregnancy at [redacted]w[redacted]d by [facility]--unexplained vaginal bleeding  S:        Perception of contractions: none      Vaginal bleeding: none now       Vaginal discharge:  no significant change  O: BP 110/50  Pulse 80  Temp(Src) 98.7 F (37.1 C) (Oral)  Resp 16  Ht 5' (1.524 m)  Wt 171 lb (77.565 kg)  BMI 33.40 kg/m2  LMP 05/18/2013      Fetal tracings: reactive, reassuring        Contractions: none      Uterus gravid, consistent with 27.5 weeks and non-tender      Extremities: extremities normal, atraumatic, no cyanosis or edema and no significant edema and no signs of DVT          Recent Results (from the past 2160 hour(s))  CBC     Status: Abnormal   Collection Time    11/24/13  6:44 PM      Result Value Ref Range   WBC 11.2 (*) 4.0 - 10.5 K/uL   RBC 3.62 (*) 3.87 - 5.11 MIL/uL   Hemoglobin 10.9 (*) 12.0 - 15.0 g/dL   HCT 32.4 (*) 36.0 - 46.0 %   MCV 89.5  78.0 - 100.0 fL   MCH 30.1  26.0 - 34.0 pg   MCHC 33.6  30.0 - 36.0 g/dL   RDW 13.1  11.5 - 15.5 %   Platelets 227  150 - 400 K/uL  TYPE AND SCREEN     Status: None   Collection Time    11/24/13  8:20 PM      Result Value Ref Range   ABO/RH(D) O POS     Antibody Screen NEG     Sample Expiration 11/27/2013           Meds: BMZ #1, Iron supplement  A: [redacted]w[redacted]d with unexplained bleeding- resolved      Fetal tracings:reassuring and reactive     Contractions: none     Uterus non-tender      Extremities: DTR 1+, no clonus, no edema   P: Continue current plan of care      Upcoming tests/treatments: BMZ #2      Assess for bleeding prior to discharge      Discharge later today after BMZ complete      Iron supplement       PT to FU in one week at the office      MDs will follow    Dali Kraner, CNM, MSN 11/25/2013. 9:59 AM

## 2014-01-21 LAB — OB RESULTS CONSOLE GBS: GBS: NEGATIVE

## 2014-02-16 ENCOUNTER — Encounter (HOSPITAL_COMMUNITY): Payer: Self-pay | Admitting: *Deleted

## 2014-02-16 ENCOUNTER — Inpatient Hospital Stay (HOSPITAL_COMMUNITY)
Admission: AD | Admit: 2014-02-16 | Discharge: 2014-02-16 | Disposition: A | Discharge status: Home / Self Care | Payer: Medicaid Other | Source: Ambulatory Visit | Attending: Obstetrics and Gynecology | Admitting: Obstetrics and Gynecology

## 2014-02-16 DIAGNOSIS — O479 False labor, unspecified: Secondary | ICD-10-CM

## 2014-02-16 DIAGNOSIS — O471 False labor at or after 37 completed weeks of gestation: Secondary | ICD-10-CM | POA: Insufficient documentation

## 2014-02-16 DIAGNOSIS — O99343 Other mental disorders complicating pregnancy, third trimester: Secondary | ICD-10-CM | POA: Diagnosis not present

## 2014-02-16 DIAGNOSIS — O09899 Supervision of other high risk pregnancies, unspecified trimester: Secondary | ICD-10-CM

## 2014-02-16 DIAGNOSIS — F458 Other somatoform disorders: Secondary | ICD-10-CM | POA: Insufficient documentation

## 2014-02-16 DIAGNOSIS — Z3A39 39 weeks gestation of pregnancy: Secondary | ICD-10-CM | POA: Diagnosis not present

## 2014-02-16 DIAGNOSIS — R6252 Short stature (child): Secondary | ICD-10-CM

## 2014-02-16 DIAGNOSIS — L299 Pruritus, unspecified: Secondary | ICD-10-CM

## 2014-02-16 LAB — COMPREHENSIVE METABOLIC PANEL
ALBUMIN: 2.6 g/dL — AB (ref 3.5–5.2)
ALK PHOS: 192 U/L — AB (ref 39–117)
ALT: 13 U/L (ref 0–35)
ANION GAP: 14 (ref 5–15)
AST: 16 U/L (ref 0–37)
BUN: 6 mg/dL (ref 6–23)
CO2: 20 mEq/L (ref 19–32)
Calcium: 8.8 mg/dL (ref 8.4–10.5)
Chloride: 102 mEq/L (ref 96–112)
Creatinine, Ser: 0.65 mg/dL (ref 0.50–1.10)
GFR calc Af Amer: >90 mL/min (ref >90)
GFR calc non Af Amer: >90 mL/min (ref >90)
Glucose, Bld: 79 mg/dL (ref 70–99)
POTASSIUM: 3.9 meq/L (ref 3.7–5.3)
Sodium: 136 mEq/L — ABNORMAL LOW (ref 137–147)
Total Bilirubin: 0.4 mg/dL (ref 0.3–1.2)
Total Protein: 6.3 g/dL (ref 6.0–8.3)

## 2014-02-16 MED ORDER — TRIAMCINOLONE ACETONIDE 0.025 % EX OINT: 1.0000 "application " | TOPICAL_OINTMENT | Freq: Two times a day (BID) | CUTANEOUS | Status: DC

## 2014-02-16 NOTE — MAU Provider Note (Signed)
History  23 yo G2P0010 @ 39.5 wks presents unannounced to MAU w/ c/o regular, painful ctxs since 6 pm this evening, worse w/ ambulation. Reports active fetus. Denies VB or LOF.  C/O generalized itching x 2 days, unrelieved w/ Benadryl.  Patient Active Problem List   Diagnosis Date Noted  . Irregular uterine contractions 02/16/2014  . Itching 02/16/2014  . Short stature 02/16/2014  . 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  HPI See above OB History    Gravida Para Term Preterm AB TAB SAB Ectopic Multiple Living   2    1  1    0      Past Medical History  Diagnosis Date  . Medical history non-contributory   . Pregnant     Past Surgical History  Procedure Laterality Date  . No past surgeries    . Dilitation and curretage      Family History  Problem Relation Age of Onset  . Cancer Maternal Aunt     History  Substance Use Topics  . Smoking status: Never Smoker   . Smokeless tobacco: Not on file  . Alcohol Use: No    Allergies: No Known Allergies  No prescriptions prior to admission    ROS  +FM Ctxs Generalized itching Physical Exam  Gen: A&O x 3 Abdomen: gravid, soft, NT Pelvic: FT-1 cm per RN Excoriations to body from scratching FHRT: BL 120 w/ mod variability, +accels, no decels Ctxs: Irregular, palpate mild Blood pressure 112/54, pulse 81, temperature 97.6 F (36.4 C), temperature source Oral, resp. rate 18, height 4\' 11"  (1.499 m), weight 182 lb 4 oz (82.668 kg), last menstrual period 05/18/2013, unknown if currently breastfeeding.  Physical Exam Results for orders placed or performed during the hospital encounter of 02/16/14 (from the past 24 hour(s))  Comprehensive metabolic panel     Status: Abnormal   Collection Time: 02/16/14  9:34 PM  Result Value Ref Range   Sodium 136  (L) 137 - 147 mEq/L   Potassium 3.9 3.7 - 5.3 mEq/L   Chloride 102 96 - 112 mEq/L   CO2 20 19 - 32 mEq/L   Glucose, Bld 79 70 - 99 mg/dL   BUN 6 6 - 23 mg/dL   Creatinine, Ser 0.65 0.50 - 1.10 mg/dL   Calcium 8.8 8.4 - 10.5 mg/dL   Total Protein 6.3 6.0 - 8.3 g/dL   Albumin 2.6 (L) 3.5 - 5.2 g/dL   AST 16 0 - 37 U/L   ALT 13 0 - 35 U/L   Alkaline Phosphatase 192 (H) 39 - 117 U/L   Total Bilirubin 0.4 0.3 - 1.2 mg/dL   GFR calc non Af Amer >90 >90 mL/min   GFR calc Af Amer >90 >90 mL/min   Anion gap 14 5 - 15    ED Course  Total bile acids CMP  Assessment: Generalized itching; r/o ICP Cat 1 FHRT  Contractions w/o cervical change  Plan: Reviewed differences between true and false labor. Offered ambulation, but pt declined, citing she would like to go home and return with worsening ctxs, bleeding like a period, decreased FM or LOF Kenalog cream Will contact pt if bile acids abnormal Strict FKCs Routine OB f/u   Farrel Gordon CNM, MS 02/16/2014 10:19 PM

## 2014-02-16 NOTE — MAU Note (Signed)
PT  SAYSS HE STARTED HURTING   BAD AT 6 PM.     WAS IN OFFICE  LAST  WED-  VE 1 CM.      DENIES HSV AND MRSA.   PT  DID NOT CALL CNM  TONIGHT.  GBS-  UNSURE.

## 2014-02-16 NOTE — MAU Note (Signed)
Lab in to draw blood as patient has been itching for past couple of days.

## 2014-02-16 NOTE — MAU Note (Signed)
Irena Reichmann CNM would like nurse to examine pt. and then will be in to assess pt.

## 2014-02-16 NOTE — Discharge Instructions (Signed)
Braxton Hicks Contractions °Contractions of the uterus can occur throughout pregnancy. Contractions are not always a sign that you are in labor.  °WHAT ARE BRAXTON HICKS CONTRACTIONS?  °Contractions that occur before labor are called Braxton Hicks contractions, or false labor. Toward the end of pregnancy (32-34 weeks), these contractions can develop more often and may become more forceful. This is not true labor because these contractions do not result in opening (dilatation) and thinning of the cervix. They are sometimes difficult to tell apart from true labor because these contractions can be forceful and people have different pain tolerances. You should not feel embarrassed if you go to the hospital with false labor. Sometimes, the only way to tell if you are in true labor is for your health care provider to look for changes in the cervix. °If there are no prenatal problems or other health problems associated with the pregnancy, it is completely safe to be sent home with false labor and await the onset of true labor. °HOW CAN YOU TELL THE DIFFERENCE BETWEEN TRUE AND FALSE LABOR? °False Labor °· The contractions of false labor are usually shorter and not as hard as those of true labor.   °· The contractions are usually irregular.   °· The contractions are often felt in the front of the lower abdomen and in the groin.   °· The contractions may go away when you walk around or change positions while lying down.   °· The contractions get weaker and are shorter lasting as time goes on.   °· The contractions do not usually become progressively stronger, regular, and closer together as with true labor.   °True Labor °· Contractions in true labor last 30-70 seconds, become very regular, usually become more intense, and increase in frequency.   °· The contractions do not go away with walking.   °· The discomfort is usually felt in the top of the uterus and spreads to the lower abdomen and low back.   °· True labor can be  determined by your health care provider with an exam. This will show that the cervix is dilating and getting thinner.   °WHAT TO REMEMBER °· Keep up with your usual exercises and follow other instructions given by your health care provider.   °· Take medicines as directed by your health care provider.   °· Keep your regular prenatal appointments.   °· Eat and drink lightly if you think you are going into labor.   °· If Braxton Hicks contractions are making you uncomfortable:   °¨ Change your position from lying down or resting to walking, or from walking to resting.   °¨ Sit and rest in a tub of warm water.   °¨ Drink 2-3 glasses of water. Dehydration may cause these contractions.   °¨ Do slow and deep breathing several times an hour.   °WHEN SHOULD I SEEK IMMEDIATE MEDICAL CARE? °Seek immediate medical care if: °· Your contractions become stronger, more regular, and closer together.   °· You have fluid leaking or gushing from your vagina.   °· You have a fever.   °· You pass blood-tinged mucus.   °· You have vaginal bleeding.   °· You have continuous abdominal pain.   °· You have low back pain that you never had before.   °· You feel your baby's head pushing down and causing pelvic pressure.   °· Your baby is not moving as much as it used to.   °Document Released: 04/03/2005 Document Revised: 04/08/2013 Document Reviewed: 01/13/2013 °[employer]® Patient Information ©2015 [employer]. This information is not intended to replace advice given to you by your health care   provider. Make sure you discuss any questions you have with your health care provider. ° °

## 2014-02-18 LAB — BILE ACIDS, TOTAL: Bile Acids Total: 10 umol/L (ref 0–19)

## 2014-02-20 ENCOUNTER — Encounter (HOSPITAL_COMMUNITY): Payer: Self-pay | Admitting: *Deleted

## 2014-02-20 ENCOUNTER — Inpatient Hospital Stay (HOSPITAL_COMMUNITY)
Admission: AD | Admit: 2014-02-20 | Discharge: 2014-02-20 | Disposition: A | Discharge status: Home / Self Care | Payer: Medicaid Other | Source: Ambulatory Visit | Attending: Obstetrics and Gynecology | Admitting: Obstetrics and Gynecology

## 2014-02-20 DIAGNOSIS — R109 Unspecified abdominal pain: Secondary | ICD-10-CM | POA: Diagnosis not present

## 2014-02-20 DIAGNOSIS — O26893 Other specified pregnancy related conditions, third trimester: Secondary | ICD-10-CM | POA: Diagnosis not present

## 2014-02-20 DIAGNOSIS — N898 Other specified noninflammatory disorders of vagina: Secondary | ICD-10-CM | POA: Insufficient documentation

## 2014-02-20 DIAGNOSIS — Z3A40 40 weeks gestation of pregnancy: Secondary | ICD-10-CM | POA: Diagnosis not present

## 2014-02-20 LAB — WET PREP, GENITAL
CLUE CELLS WET PREP: NONE SEEN
Trich, Wet Prep: NONE SEEN
Yeast Wet Prep HPF POC: NONE SEEN

## 2014-02-20 MED ORDER — MORPHINE SULFATE 4 MG/ML IJ SOLN
4.0000 mg | Freq: Once | INTRAMUSCULAR | Status: AC
  Administered 2014-02-20: 4 mg via INTRAMUSCULAR
  Filled 2014-02-20: qty 1

## 2014-02-20 MED ORDER — ZOLPIDEM TARTRATE 5 MG PO TABS: 5.0000 mg | ORAL_TABLET | Freq: Once | ORAL | Status: DC

## 2014-02-20 NOTE — Discharge Instructions (Signed)
Abdominal Pain During Pregnancy °Belly (abdominal) pain is common during pregnancy. Most of the time, it is not a serious problem. Other times, it can be a sign that something is wrong with the pregnancy. Always tell your doctor if you have belly pain. °HOME CARE °Monitor your belly pain for any changes. The following actions may help you feel better: °· Do not have sex (intercourse) or put anything in your vagina until you feel better. °· Rest until your pain stops. °· Drink clear fluids if you feel sick to your stomach (nauseous). Do not eat solid food until you feel better. °· Only take medicine as told by your doctor. °· Keep all doctor visits as told. °GET HELP RIGHT AWAY IF:  °· You are bleeding, leaking fluid, or pieces of tissue come out of your vagina. °· You have more pain or cramping. °· You keep throwing up (vomiting). °· You have pain when you pee (urinate) or have blood in your pee. °· You have a fever. °· You do not feel your baby moving as much. °· You feel very weak or feel like passing out. °· You have trouble breathing, with or without belly pain. °· You have a very bad headache and belly pain. °· You have fluid leaking from your vagina and belly pain. °· You keep having watery poop (diarrhea). °· Your belly pain does not go away after resting, or the pain gets worse. °MAKE SURE YOU:  °· Understand these instructions. °· Will watch your condition. °· Will get help right away if you are not doing well or get worse. °Document Released: 03/22/2009 Document Revised: 12/04/2012 Document Reviewed: 10/31/2012 °[employer]® Patient Information ©2015 [employer]. This information is not intended to replace advice given to you by your health care provider. Make sure you discuss any questions you have with your health care provider. ° °

## 2014-02-20 NOTE — MAU Note (Signed)
Pt. States she is here for abdominal pain that is constant. Denies leakage of fluid or bleeding. Unable to give urine sample. Baby is moving well. Pt. States her next OB appointment is Wednesday.

## 2014-02-20 NOTE — MAU Provider Note (Signed)
History    Lindsey Beard is a 23 y.o. G2P0010 at 40.2wks who presents for abdominal pain.  Patient states pain is in lower abdominal area and is constant.  Patient also reporting dizziness and vaginal pressure with recent onset of vaginal itching and burning. Patient reports fetal movement and is unsure of contractions.  Patient denies LOF, VB, and issues with urination or GI concerns.  Patient tearful and appears frustrated, stating "I am just done."     Patient Active Problem List   Diagnosis Date Noted  . Irregular uterine contractions 02/16/2014  . Itching 02/16/2014  . Short stature 02/16/2014  . Two vessel umbilical cord, antepartum 02/16/2014    Chief Complaint  Patient presents with  . Labor Eval  . Dizziness   HPI  OB History    Gravida Para Term Preterm AB TAB SAB Ectopic Multiple Living   2    1  1    0      Past Medical History  Diagnosis Date  . Medical history non-contributory   . Pregnant     Past Surgical History  Procedure Laterality Date  . No past surgeries    . Dilitation and curretage      Family History  Problem Relation Age of Onset  . Cancer Maternal Aunt     History  Substance Use Topics  . Smoking status: Never Smoker   . Smokeless tobacco: Not on file  . Alcohol Use: No    Allergies: No Known Allergies  Prescriptions prior to admission  Medication Sig Dispense Refill Last Dose  . acetaminophen (TYLENOL) 500 MG tablet Take 500 mg by mouth every 6 (six) hours as needed for mild pain.   02/20/2014 at Unknown time  . calcium carbonate (TUMS - DOSED IN MG ELEMENTAL CALCIUM) 500 MG chewable tablet Chew 2-3 tablets by mouth 4 (four) times daily as needed.    02/20/2014 at Unknown time  . Prenatal Vit-Fe Fumarate-FA (PRENATAL MULTIVITAMIN) TABS tablet Take 1 tablet by mouth daily.    Past Week at Unknown time  . ferrous sulfate 325 (65 FE) MG tablet Take 1 tablet (325 mg total) by mouth 2 (two) times daily with a meal. (Patient not taking:  Reported on 02/20/2014) 60 tablet 3   . triamcinolone (KENALOG) 0.025 % ointment Apply 1 application topically 2 (two) times daily. (Patient not taking: Reported on 02/20/2014) 30 g 0     ROS  See HPI Above Physical Exam   Blood pressure 115/58, pulse 101, temperature 98.6 F (37 C), temperature source Oral, resp. rate 16, last menstrual period 05/18/2013, unknown if currently breastfeeding.  Physical Exam  Constitutional: She is oriented to person, place, and time. She appears well-developed and well-nourished.  Cardiovascular: Normal rate, regular rhythm and normal heart sounds.   Respiratory: Effort normal and breath sounds normal.  GI: Soft. There is tenderness in the left upper quadrant and left lower quadrant.  Neurological: She is alert and oriented to person, place, and time.  Skin: Skin is warm and dry.   FHR: 125 bpm, Mod Var, -Decels, +Accels UC: Occassional, Irritability noted ED Course  Assessment: IUP at 40.2wks Cat I FT Abdominal Pain Vaginal Discharge  Plan: -Labs: UA, Wet Prep -Discussed Fetal Presentation & Effects on Abdominal Pain, Reassurances given -Morphine IM   Follow Up (2140) -Patient refuse UA -Wet Prep-Negative -Bleeding and Labor Precautions -Discharge to home in stable condition  Davone Shinault, [place], MSN 02/20/2014 8:25 PM

## 2014-02-20 NOTE — MAU Note (Signed)
Unbearable pains in abd. Pains come and go.  Pains and are getting closer and stronger. No bleeding or leaking.

## 2014-02-27 ENCOUNTER — Telehealth (HOSPITAL_COMMUNITY): Payer: Self-pay | Admitting: *Deleted

## 2014-02-27 ENCOUNTER — Encounter (HOSPITAL_COMMUNITY): Payer: Self-pay | Admitting: *Deleted

## 2014-02-27 ENCOUNTER — Encounter (HOSPITAL_COMMUNITY): Payer: Self-pay

## 2014-02-27 ENCOUNTER — Inpatient Hospital Stay (HOSPITAL_COMMUNITY)
Admission: RE | Admit: 2014-02-27 | Discharge: 2014-03-04 | DRG: 766 | Disposition: A | Discharge status: Home / Self Care | Payer: Medicaid Other | Source: Ambulatory Visit | Attending: Obstetrics and Gynecology | Admitting: Obstetrics and Gynecology

## 2014-02-27 DIAGNOSIS — Z3A41 41 weeks gestation of pregnancy: Secondary | ICD-10-CM | POA: Diagnosis present

## 2014-02-27 DIAGNOSIS — O480 Post-term pregnancy: Secondary | ICD-10-CM | POA: Diagnosis present

## 2014-02-27 DIAGNOSIS — O9902 Anemia complicating childbirth: Secondary | ICD-10-CM | POA: Diagnosis present

## 2014-02-27 DIAGNOSIS — O26893 Other specified pregnancy related conditions, third trimester: Secondary | ICD-10-CM | POA: Diagnosis present

## 2014-02-27 DIAGNOSIS — D649 Anemia, unspecified: Secondary | ICD-10-CM | POA: Diagnosis present

## 2014-02-27 DIAGNOSIS — R6252 Short stature (child): Secondary | ICD-10-CM | POA: Diagnosis present

## 2014-02-27 DIAGNOSIS — O9972 Diseases of the skin and subcutaneous tissue complicating childbirth: Secondary | ICD-10-CM | POA: Diagnosis present

## 2014-02-27 DIAGNOSIS — F458 Other somatoform disorders: Secondary | ICD-10-CM | POA: Diagnosis present

## 2014-02-27 DIAGNOSIS — Z98891 History of uterine scar from previous surgery: Secondary | ICD-10-CM

## 2014-02-27 LAB — CBC
HEMATOCRIT: 33.8 % — AB (ref 36.0–46.0)
Hemoglobin: 11.5 g/dL — ABNORMAL LOW (ref 12.0–15.0)
MCH: 30 pg (ref 26.0–34.0)
MCHC: 34 g/dL (ref 30.0–36.0)
MCV: 88.3 fL (ref 78.0–100.0)
Platelets: 245 10*3/uL (ref 150–400)
RBC: 3.83 MIL/uL — ABNORMAL LOW (ref 3.87–5.11)
RDW: 14.2 % (ref 11.5–15.5)
WBC: 7.8 10*3/uL (ref 4.0–10.5)

## 2014-02-27 MED ORDER — OXYCODONE-ACETAMINOPHEN 5-325 MG PO TABS: 2.0000 | ORAL_TABLET | ORAL | Status: DC | PRN

## 2014-02-27 MED ORDER — CITRIC ACID-SODIUM CITRATE 334-500 MG/5ML PO SOLN
30.0000 mL | ORAL | Status: DC | PRN
  Administered 2014-02-28 – 2014-03-01 (×2): 30 mL via ORAL
  Filled 2014-02-27 (×2): qty 15

## 2014-02-27 MED ORDER — LIDOCAINE HCL (PF) 1 % IJ SOLN: 30.0000 mL | INTRAMUSCULAR | Status: DC | PRN

## 2014-02-27 MED ORDER — NALBUPHINE HCL 10 MG/ML IJ SOLN
5.0000 mg | INTRAMUSCULAR | Status: DC | PRN
  Administered 2014-02-28 (×2): 5 mg via INTRAVENOUS
  Filled 2014-02-27 (×2): qty 1

## 2014-02-27 MED ORDER — TERBUTALINE SULFATE 1 MG/ML IJ SOLN: 0.2500 mg | Freq: Once | INTRAMUSCULAR | Status: AC | PRN

## 2014-02-27 MED ORDER — ACETAMINOPHEN 325 MG PO TABS: 650.0000 mg | ORAL_TABLET | ORAL | Status: DC | PRN

## 2014-02-27 MED ORDER — FLEET ENEMA 7-19 GM/118ML RE ENEM: 1.0000 | ENEMA | RECTAL | Status: DC | PRN

## 2014-02-27 MED ORDER — MISOPROSTOL 25 MCG QUARTER TABLET
25.0000 ug | ORAL_TABLET | ORAL | Status: DC | PRN
  Administered 2014-02-27: 25 ug via VAGINAL
  Filled 2014-02-27 (×2): qty 0.25

## 2014-02-27 MED ORDER — LACTATED RINGERS IV SOLN
INTRAVENOUS | Status: DC
  Administered 2014-02-27 – 2014-03-01 (×5): via INTRAVENOUS

## 2014-02-27 MED ORDER — OXYTOCIN 40 UNITS IN LACTATED RINGERS INFUSION - SIMPLE MED: 62.5000 mL/h | INTRAVENOUS | Status: DC

## 2014-02-27 MED ORDER — OXYTOCIN BOLUS FROM INFUSION: 500.0000 mL | INTRAVENOUS | Status: DC

## 2014-02-27 MED ORDER — ONDANSETRON HCL 4 MG/2ML IJ SOLN
4.0000 mg | Freq: Four times a day (QID) | INTRAMUSCULAR | Status: DC | PRN
  Administered 2014-02-28: 4 mg via INTRAVENOUS
  Filled 2014-02-27: qty 2

## 2014-02-27 MED ORDER — LACTATED RINGERS IV SOLN
500.0000 mL | INTRAVENOUS | Status: DC | PRN
  Administered 2014-02-28: 500 mL via INTRAVENOUS

## 2014-02-27 MED ORDER — OXYCODONE-ACETAMINOPHEN 5-325 MG PO TABS: 1.0000 | ORAL_TABLET | ORAL | Status: DC | PRN

## 2014-02-27 NOTE — Progress Notes (Addendum)
S: Desired light meal prior to starting induction. Reports active fetus. Denies LOF, VB or ctxs. Family at bedside.  O: VSS, afebrile BL FHR 135 w/ mod variability, +accels, no decels No ctxs Cephalic by [place] Cx: 9/49/-9 Cytotec #1 placed at 2123 w/o incident A: 23 yo G1P0 @ 41.2 wks IOL for late term pregnancy GBS neg Cat 1 FHRT P: Re-evaluate in 4 hrs, sooner if indicated Intermittent monitoring between Cytotec doses Dr. Landry Mellow updated   Lindsey Beard, CNM 02/27/14, 9:31 PM

## 2014-02-27 NOTE — Telephone Encounter (Signed)
Preadmission screen  

## 2014-02-28 ENCOUNTER — Encounter (HOSPITAL_COMMUNITY): Payer: Self-pay

## 2014-02-28 ENCOUNTER — Inpatient Hospital Stay (HOSPITAL_COMMUNITY): Payer: Medicaid Other | Admitting: Anesthesiology

## 2014-02-28 LAB — TYPE AND SCREEN
ABO/RH(D): O POS
Antibody Screen: NEGATIVE

## 2014-02-28 LAB — RPR

## 2014-02-28 LAB — HIV ANTIBODY (ROUTINE TESTING W REFLEX): HIV 1&2 Ab, 4th Generation: NONREACTIVE

## 2014-02-28 MED ORDER — FENTANYL 2.5 MCG/ML BUPIVACAINE 1/10 % EPIDURAL INFUSION (WH - ANES)
INTRAMUSCULAR | Status: DC | PRN
  Administered 2014-02-28: 14 mL/h via EPIDURAL

## 2014-02-28 MED ORDER — MISOPROSTOL 25 MCG QUARTER TABLET: 25.0000 ug | ORAL_TABLET | ORAL | Status: DC | PRN

## 2014-02-28 MED ORDER — ZOLPIDEM TARTRATE 5 MG PO TABS: 5.0000 mg | ORAL_TABLET | Freq: Every evening | ORAL | Status: DC | PRN

## 2014-02-28 MED ORDER — PHENYLEPHRINE 40 MCG/ML (10ML) SYRINGE FOR IV PUSH (FOR BLOOD PRESSURE SUPPORT)
80.0000 ug | PREFILLED_SYRINGE | INTRAVENOUS | Status: DC | PRN
  Filled 2014-02-28: qty 10

## 2014-02-28 MED ORDER — EPHEDRINE 5 MG/ML INJ: 10.0000 mg | INTRAVENOUS | Status: DC | PRN

## 2014-02-28 MED ORDER — PHENYLEPHRINE 40 MCG/ML (10ML) SYRINGE FOR IV PUSH (FOR BLOOD PRESSURE SUPPORT): 80.0000 ug | PREFILLED_SYRINGE | INTRAVENOUS | Status: DC | PRN

## 2014-02-28 MED ORDER — TERBUTALINE SULFATE 1 MG/ML IJ SOLN: 0.2500 mg | Freq: Once | INTRAMUSCULAR | Status: AC | PRN

## 2014-02-28 MED ORDER — FENTANYL 2.5 MCG/ML BUPIVACAINE 1/10 % EPIDURAL INFUSION (WH - ANES)
14.0000 mL/h | INTRAMUSCULAR | Status: DC | PRN
  Administered 2014-02-28 – 2014-03-01 (×3): 14 mL/h via EPIDURAL
  Filled 2014-02-28 (×3): qty 125

## 2014-02-28 MED ORDER — NALBUPHINE HCL 10 MG/ML IJ SOLN
10.0000 mg | INTRAMUSCULAR | Status: DC | PRN
  Administered 2014-02-28: 10 mg via INTRAVENOUS
  Filled 2014-02-28: qty 1

## 2014-02-28 MED ORDER — LIDOCAINE HCL (PF) 1 % IJ SOLN
INTRAMUSCULAR | Status: DC | PRN
  Administered 2014-02-28 (×2): 4 mL

## 2014-02-28 MED ORDER — DIPHENHYDRAMINE HCL 50 MG/ML IJ SOLN: 12.5000 mg | INTRAMUSCULAR | Status: DC | PRN

## 2014-02-28 MED ORDER — LACTATED RINGERS IV SOLN
500.0000 mL | Freq: Once | INTRAVENOUS | Status: AC
  Administered 2014-02-28: 500 mL via INTRAVENOUS

## 2014-02-28 MED ORDER — OXYTOCIN 40 UNITS IN LACTATED RINGERS INFUSION - SIMPLE MED
1.0000 m[IU]/min | INTRAVENOUS | Status: DC
  Administered 2014-02-28: 1.5 m[IU]/min via INTRAVENOUS
  Filled 2014-02-28: qty 1000

## 2014-02-28 NOTE — Plan of Care (Signed)
Windy Kalata CNM in to talk with pt re: POC, pain mgmt.  Pt and SO encouraged to ask questions, both express understanding and agreement.

## 2014-02-28 NOTE — Progress Notes (Signed)
  Subjective: Comfortable with epidural.  Family in and out.  Objective: BP 115/51 mmHg  Pulse 68  Temp(Src) 98.3 F (36.8 C) (Oral)  Resp 20  Ht 4\' 11"  (1.499 m)  Wt 182 lb (82.555 kg)  BMI 36.74 kg/m2  SpO2 97%  LMP 05/18/2013      FHT: Category 1 UC:   regular, every 2-4 minutes SVE:   3+, 90%, vtx, -2, BBOW AROM--clear fluid IUPC placed without difficulty Foley cath inserted without difficulty  Pitocin at 1.5 mu/min  Assessment:  Progressive labor  Plan: Continue pitocin to establish/maintain adequacy of labor.  Donnel Saxon CNM 02/28/2014, 2:34 PM

## 2014-02-28 NOTE — Progress Notes (Signed)
  Subjective: Sleeping, yet easily aroused. Had one dose of Nubain at [place]. States feels stomach tightening, but no pain. Reports active fetus. Denies LOF or VB.  Objective: BP 103/55 mmHg  Pulse 76  Temp(Src) 98.1 F (36.7 C) (Oral)  Resp 18  Ht 4\' 11"  (1.499 m)  Wt 182 lb (82.555 kg)  BMI 36.74 kg/m2  LMP 05/18/2013      FHT: BL 120 w/ min-mod variability, +accels, no decels UC:   irregular, every 1-3 minutes SVE:   Dilation: Closed Effacement (%): 30 Station: -3 Exam by:: Lindsey Beard, CNM   Assessment:  IUP at 41.3 wks IOL due to late term pregnancy Cat 1 FHRT GBS neg  Plan: Defer Cytotec #2. Re-evaluate in 3 hrs.  Lindsey Beard CNM 02/28/2014, 3:26 AM

## 2014-02-28 NOTE — H&P (Signed)
Lindsey Beard is a 23 y.o. female @ 41.2 wks by 11-wk scan presenting for IOL due to late term pregnancy. Reports active fetus. Denies LOF or VB.   Maternal Medical History:  Reason for admission: IOL due to late term pregnancy  Contractions: None  Fetal activity: Perceived fetal activity is normal.   Last perceived fetal movement was within the past hour.    Prenatal complications: No bleeding.   Prenatal Complications - Diabetes: none.    OB History    Gravida Para Term Preterm AB TAB SAB Ectopic Multiple Living   2    1  1    0     SAB 12/16/12 @ 12 wks - D&C at [facility]; no complications   Past Medical History  Diagnosis Date  . Medical history non-contributory   . Pregnant   . Hx of chlamydia infection   . Hx of varicella    Past Surgical History  Procedure Laterality Date  . No past surgeries    . Dilitation and curretage     Family History: family history includes Cancer in her maternal aunt. Social History:  reports that she has never smoked. She does not have any smokeless tobacco history on file. She reports that she does not drink alcohol or use illicit drugs.Pt is a single AA female with a 12th grade education. FOB [place]) and other family members present and supportive. Of note, fetus w/ 2VC (EFWs normal), exposure to Treponema pallidum from partner who was incarcerated. Treated 10/22/13 w/ Bicillin injection by [facility]; subsequent RPRs neg.   Prenatal Transfer Tool  Maternal Diabetes: No Genetic Screening: Normal Maternal Ultrasounds/Referrals: Abnormal:  Findings:   Other: 2VC Fetal Ultrasounds or other Referrals:  Other: monthly ultrasounds Maternal Substance Abuse:  No Significant Maternal Medications:  Meds include: Other: PNVs, Zofran ODT Significant Maternal Lab Results:  Lab values include: Group B Strep negative Other Comments:  Final growth scan due to [facility] on 02/18/14 = EFW 8lbs 4 oz (3728 g), AFI 12.19 cm (50th%), BPP 8/8, vtx,  anterior placenta, normal adnexas   Pt will accept blood transfusion in an emergency  NKDA  Height: 5'0    Wt: 183  Review of Systems  Gastrointestinal: Negative for heartburn, vomiting, abdominal pain, diarrhea and constipation.    Maternal Exam:  Uterine Assessment: None palpated  Abdomen: Patient reports no abdominal tenderness. Fundal height is CWD.   Estimated fetal weight is 8 lbs.   Fetal presentation: vertex  Introitus: Normal vulva. Normal vagina.  Pelvis: questionable for delivery.   Cervix: Cervix evaluated by digital exam.     Fetal Exam Fetal Monitor Review: Mode: fetoscope.   Baseline rate: 135.  Variability: moderate (6-25 bpm).   Pattern: accelerations present and no decelerations.    Fetal State Assessment: Category I - tracings are normal.    Cvx: 0/30/-3   Physical Exam  Constitutional: She appears well-developed and well-nourished. No distress.  HENT:  Head: Normocephalic and atraumatic.  Cardiovascular: Normal rate and regular rhythm.   Respiratory: Effort normal and breath sounds normal.  Musculoskeletal: Normal range of motion.  Neurological: She has normal reflexes.  Skin: Skin is warm and dry.  Psychiatric: She has a normal mood and affect.    Prenatal labs: ABO, Rh: --/--/O POS (11/13 2050) Antibody: NEG (11/13 2050) Rubella: Immune (03/27 0000) RPR: NON REAC (11/13 2050)  HBsAg: Negative (03/27 0000)  HIV: NONREACTIVE (11/13 2050)  GBS: Negative (10/07 0000)    Vital signs stable,  afebrile  Assessment/Plan: IUP at 41.2 wks IOL due to late term pregnancy Reviewed R&B of induction with patient and family, including need for serial induction, risk of C/S, need for further intervention. Patient and family seem to understand these risks and are agreeable with proceeding.  Will proceed w/ Cytotec IV pain meds as desired    Farrel Gordon 02/27/14, 10:00 PM

## 2014-02-28 NOTE — Progress Notes (Signed)
  Subjective: Reports last dose of pain med not effective.    Objective: BP 111/67 mmHg  Pulse 71  Temp(Src) 98.3 F (36.8 C) (Oral)  Resp 20  Ht 4\' 11"  (1.499 m)  Wt 182 lb (82.555 kg)  BMI 36.74 kg/m2  LMP 05/18/2013      FHT: Category 1 UC:   regular, every 3 minutes SVE:   Dilation: 2 Effacement (%): 50 Station: -2 Exam by:: Windy Kalata CNM  Cervix posterior Pitocin at 1.5 mu/min  Assessment:  Induction for post-dates Latent/early labor Diminished response to IV pain meds.  Plan: Will place epidural and continue to augment.  Donnel Saxon CNM 02/28/2014, 11:46 AM

## 2014-02-28 NOTE — Anesthesia Procedure Notes (Signed)
Epidural Patient location during procedure: OB  Staffing Anesthesiologist: Nolon Nations R Performed by: anesthesiologist   Preanesthetic Checklist Completed: patient identified, pre-op evaluation, timeout performed, IV checked, risks and benefits discussed and monitors and equipment checked  Epidural Patient position: sitting Prep: site prepped and draped and DuraPrep Patient monitoring: heart rate Approach: midline Location: L3-L4 Injection technique: LOR air and LOR saline  Needle:  Needle type: Tuohy  Needle gauge: 17 G Needle length: 9 cm Needle insertion depth: 7 cm Catheter type: closed end flexible Catheter size: 19 Gauge Catheter at skin depth: 13 cm Test dose: negative  Assessment Sensory level: T8 Events: blood not aspirated, injection not painful, no injection resistance, negative IV test and no paresthesia  Additional Notes Reason for block:procedure for pain

## 2014-02-28 NOTE — Anesthesia Preprocedure Evaluation (Signed)
Anesthesia Evaluation  Patient identified by MRN, date of birth, ID band Patient awake    Reviewed: Allergy & Precautions, H&P , NPO status , Patient's Chart, lab work & pertinent test results  Airway Mallampati: II  TM Distance: >3 FB Neck ROM: Full    Dental no notable dental hx.    Pulmonary neg pulmonary ROS,  breath sounds clear to auscultation  Pulmonary exam normal       Cardiovascular negative cardio ROS  Rhythm:Regular Rate:Normal     Neuro/Psych negative neurological ROS  negative psych ROS   GI/Hepatic negative GI ROS, Neg liver ROS,   Endo/Other  negative endocrine ROS  Renal/GU negative Renal ROS     Musculoskeletal negative musculoskeletal ROS (+)   Abdominal (+) + obese,   Peds  Hematology negative hematology ROS (+)   Anesthesia Other Findings   Reproductive/Obstetrics (+) Pregnancy                             Anesthesia Physical Anesthesia Plan  ASA: III  Anesthesia Plan: Epidural   Post-op Pain Management:    Induction:   Airway Management Planned:   Additional Equipment:   Intra-op Plan:   Post-operative Plan:   Informed Consent: I have reviewed the patients History and Physical, chart, labs and discussed the procedure including the risks, benefits and alternatives for the proposed anesthesia with the patient or authorized representative who has indicated his/her understanding and acceptance.     Plan Discussed with:   Anesthesia Plan Comments:         Anesthesia Quick Evaluation

## 2014-02-28 NOTE — Progress Notes (Signed)
  Subjective: Slept at intervals--FOB at bedside, very supportive.  Reports Nubain effective. Breathing with UCs.  Objective: BP 102/56 mmHg  Pulse 55  Temp(Src) 98.2 F (36.8 C) (Oral)  Resp 18  Ht 4\' 11"  (1.499 m)  Wt 182 lb (82.555 kg)  BMI 36.74 kg/m2  LMP 05/18/2013      FHT: Category 1 UC:  q 2-4 min, moderate SVE:   1 cm, 50%, vtx, -2   Assessment:  IUP at 41 3/7 weeks--induction for post-dates GBS negative  Plan: Will start pitocin per low-dose protocol. Patient plans epidural as labor advances.  Donnel Saxon CNM 02/28/2014, 8:46 AM

## 2014-02-28 NOTE — Progress Notes (Signed)
Pt visibly upset, states family tension re: who can be present for delivery.  Encouraged pt to voice her desires and enlist staff as needed to reinforce her wishes.

## 2014-02-28 NOTE — Progress Notes (Addendum)
  Subjective: Received 2nd dose of Nubain at 0516; pt expresses relief. Family at bedside.  Objective: BP 103/55 mmHg  Pulse 76  Temp(Src) 98.1 F (36.7 C) (Oral)  Resp 18  Ht 4\' 11"  (1.499 m)  Wt 182 lb (82.555 kg)  BMI 36.74 kg/m2  LMP 05/18/2013     FHT: BL 110 w/ mod variability, +accels, no decels  UC:   irregular, every 1-3 minutes SVE:   Dilation: Closed Effacement (%): 30 Station: -3 Exam by:: Irena Reichmann, CNM   Assessment:  Cat 1 FHRT GBS neg Fetus w/ [place]  Plan: Defer Cytotec at this time Dr. Landry Mellow updated Defer next evaluation to oncoming CNM  Farrel Gordon CNM 02/28/2014, 6:34 AM

## 2014-02-28 NOTE — Progress Notes (Addendum)
  Subjective: Assuming care. Comfortable w/ epidural, but "feeling pressure in my butt." C/O acid feeling in stomach. Family at bedside.  Objective: BP 115/60 mmHg  Pulse 86  Temp(Src) 97.5 F (36.4 C) (Oral)  Resp 20  Ht 4\' 11"  (1.499 m)  Wt 182 lb (82.555 kg)  BMI 36.74 kg/m2  SpO2 98%  LMP 05/18/2013 I/O last 3 completed shifts: In: -  Out: 200 [Emesis/NG output:200]    FHT: BL 130 w/ mod variability, +accels, earlys UC:   irregular, every 3-4 minutes SVE: Deferred    Pitocin at 5 mius/min MVUs 220  Assessment:  IUP at 41.3 wks, IOL Cat 1 FHRT Adequate MVUs GBS neg  Plan: Given Oracit for acid feeling in stomach; vomited immediately after intake; Zofran IV given Continue current management Consult prn  Farrel Gordon CNM 02/28/2014, 7:48 PM

## 2014-03-01 ENCOUNTER — Encounter (HOSPITAL_COMMUNITY)
Admission: RE | Disposition: A | Discharge status: Home / Self Care | Payer: Self-pay | Source: Ambulatory Visit | Attending: Obstetrics and Gynecology

## 2014-03-01 ENCOUNTER — Encounter (HOSPITAL_COMMUNITY): Payer: Self-pay

## 2014-03-01 SURGERY — Surgical Case
Anesthesia: Epidural | Site: Abdomen

## 2014-03-01 MED ORDER — ONDANSETRON HCL 4 MG/2ML IJ SOLN: 4.0000 mg | INTRAMUSCULAR | Status: DC | PRN

## 2014-03-01 MED ORDER — ONDANSETRON HCL 4 MG/2ML IJ SOLN
INTRAMUSCULAR | Status: DC | PRN
  Administered 2014-03-01: 4 mg via INTRAVENOUS

## 2014-03-01 MED ORDER — METHYLERGONOVINE MALEATE 0.2 MG/ML IJ SOLN
0.2000 mg | INTRAMUSCULAR | Status: DC | PRN
Start: 2014-03-01 — End: 2014-03-04

## 2014-03-01 MED ORDER — PRENATAL MULTIVITAMIN CH
1.0000 | ORAL_TABLET | Freq: Every day | ORAL | Status: DC
  Administered 2014-03-01 – 2014-03-04 (×4): 1 via ORAL
  Filled 2014-03-01 (×4): qty 1

## 2014-03-01 MED ORDER — NALBUPHINE HCL 10 MG/ML IJ SOLN: 5.0000 mg | Freq: Once | INTRAMUSCULAR | Status: AC | PRN

## 2014-03-01 MED ORDER — DIBUCAINE 1 % RE OINT: 1.0000 "application " | TOPICAL_OINTMENT | RECTAL | Status: DC | PRN

## 2014-03-01 MED ORDER — MEPERIDINE HCL 25 MG/ML IJ SOLN
INTRAMUSCULAR | Status: AC
Start: 2014-03-01 — End: 2014-03-01
  Filled 2014-03-01: qty 1

## 2014-03-01 MED ORDER — DIPHENHYDRAMINE HCL 50 MG/ML IJ SOLN: 12.5000 mg | INTRAMUSCULAR | Status: DC | PRN

## 2014-03-01 MED ORDER — SCOPOLAMINE 1 MG/3DAYS TD PT72
MEDICATED_PATCH | TRANSDERMAL | Status: AC
  Filled 2014-03-01: qty 1

## 2014-03-01 MED ORDER — ONDANSETRON HCL 4 MG PO TABS: 4.0000 mg | ORAL_TABLET | ORAL | Status: DC | PRN

## 2014-03-01 MED ORDER — MENTHOL 3 MG MT LOZG: 1.0000 | LOZENGE | OROMUCOSAL | Status: DC | PRN

## 2014-03-01 MED ORDER — SENNOSIDES-DOCUSATE SODIUM 8.6-50 MG PO TABS
2.0000 | ORAL_TABLET | ORAL | Status: DC
  Administered 2014-03-01 – 2014-03-03 (×3): 2 via ORAL
  Filled 2014-03-01 (×3): qty 2

## 2014-03-01 MED ORDER — NALOXONE HCL 0.4 MG/ML IJ SOLN: 0.4000 mg | INTRAMUSCULAR | Status: DC | PRN

## 2014-03-01 MED ORDER — PHENYLEPHRINE HCL 10 MG/ML IJ SOLN
INTRAMUSCULAR | Status: DC | PRN
  Administered 2014-03-01 (×2): 80 ug via INTRAVENOUS

## 2014-03-01 MED ORDER — OXYTOCIN 10 UNIT/ML IJ SOLN
40.0000 [IU] | INTRAMUSCULAR | Status: DC | PRN
  Administered 2014-03-01: 40 [IU] via INTRAVENOUS

## 2014-03-01 MED ORDER — ONDANSETRON HCL 4 MG/2ML IJ SOLN
INTRAMUSCULAR | Status: AC
  Filled 2014-03-01: qty 2

## 2014-03-01 MED ORDER — WITCH HAZEL-GLYCERIN EX PADS: 1.0000 "application " | MEDICATED_PAD | CUTANEOUS | Status: DC | PRN

## 2014-03-01 MED ORDER — ACETAMINOPHEN 10 MG/ML IV SOLN
1000.0000 mg | Freq: Once | INTRAVENOUS | Status: AC
  Administered 2014-03-01: 1000 mg via INTRAVENOUS
  Filled 2014-03-01: qty 100

## 2014-03-01 MED ORDER — HYDROMORPHONE HCL 1 MG/ML IJ SOLN
INTRAMUSCULAR | Status: AC
  Administered 2014-03-01: 0.5 mg
  Filled 2014-03-01: qty 1

## 2014-03-01 MED ORDER — MEPERIDINE HCL 25 MG/ML IJ SOLN: 6.2500 mg | INTRAMUSCULAR | Status: DC | PRN

## 2014-03-01 MED ORDER — LACTATED RINGERS IV SOLN
INTRAVENOUS | Status: DC | PRN
  Administered 2014-03-01 (×2): via INTRAVENOUS

## 2014-03-01 MED ORDER — KETOROLAC TROMETHAMINE 30 MG/ML IJ SOLN: 30.0000 mg | Freq: Four times a day (QID) | INTRAMUSCULAR | Status: DC | PRN

## 2014-03-01 MED ORDER — LACTATED RINGERS IV SOLN
INTRAVENOUS | Status: DC | PRN
  Administered 2014-03-01: 03:00:00 via INTRAVENOUS

## 2014-03-01 MED ORDER — NALBUPHINE HCL 10 MG/ML IJ SOLN: 5.0000 mg | INTRAMUSCULAR | Status: DC | PRN

## 2014-03-01 MED ORDER — METHYLERGONOVINE MALEATE 0.2 MG/ML IJ SOLN
INTRAMUSCULAR | Status: DC | PRN
  Administered 2014-03-01: 0.2 mg via INTRAMUSCULAR

## 2014-03-01 MED ORDER — PHENYLEPHRINE 8 MG IN D5W 100 ML (0.08MG/ML) PREMIX OPTIME
INJECTION | INTRAVENOUS | Status: AC
  Filled 2014-03-01: qty 200

## 2014-03-01 MED ORDER — FERROUS SULFATE 325 (65 FE) MG PO TABS
325.0000 mg | ORAL_TABLET | Freq: Two times a day (BID) | ORAL | Status: DC
  Administered 2014-03-01 – 2014-03-04 (×6): 325 mg via ORAL
  Filled 2014-03-01 (×6): qty 1

## 2014-03-01 MED ORDER — SODIUM BICARBONATE 8.4 % IV SOLN
INTRAVENOUS | Status: DC | PRN
  Administered 2014-03-01: 2 mL via EPIDURAL
  Administered 2014-03-01: 5 mL via EPIDURAL
  Administered 2014-03-01: 3 mL via EPIDURAL
  Administered 2014-03-01: 5 mL via EPIDURAL

## 2014-03-01 MED ORDER — SODIUM BICARBONATE 8.4 % IV SOLN
INTRAVENOUS | Status: AC
  Filled 2014-03-01: qty 50

## 2014-03-01 MED ORDER — MEPERIDINE HCL 25 MG/ML IJ SOLN
INTRAMUSCULAR | Status: DC | PRN
  Administered 2014-03-01 (×2): 12.5 mg via INTRAVENOUS

## 2014-03-01 MED ORDER — KETOROLAC TROMETHAMINE 30 MG/ML IJ SOLN
30.0000 mg | Freq: Four times a day (QID) | INTRAMUSCULAR | Status: DC | PRN
  Administered 2014-03-01: 30 mg via INTRAVENOUS

## 2014-03-01 MED ORDER — NALOXONE HCL 1 MG/ML IJ SOLN
1.0000 ug/kg/h | INTRAVENOUS | Status: DC | PRN
  Filled 2014-03-01: qty 2

## 2014-03-01 MED ORDER — PROMETHAZINE HCL 25 MG/ML IJ SOLN: 6.2500 mg | INTRAMUSCULAR | Status: DC | PRN

## 2014-03-01 MED ORDER — ONDANSETRON HCL 4 MG/2ML IJ SOLN: 4.0000 mg | Freq: Three times a day (TID) | INTRAMUSCULAR | Status: DC | PRN

## 2014-03-01 MED ORDER — SIMETHICONE 80 MG PO CHEW
80.0000 mg | CHEWABLE_TABLET | ORAL | Status: DC
  Administered 2014-03-01 – 2014-03-03 (×2): 80 mg via ORAL
  Filled 2014-03-01 (×3): qty 1

## 2014-03-01 MED ORDER — OXYCODONE-ACETAMINOPHEN 5-325 MG PO TABS
2.0000 | ORAL_TABLET | ORAL | Status: DC | PRN
  Administered 2014-03-02: 2 via ORAL
  Administered 2014-03-03: 1 via ORAL
  Filled 2014-03-01 (×3): qty 2

## 2014-03-01 MED ORDER — CEFAZOLIN SODIUM-DEXTROSE 2-3 GM-% IV SOLR
INTRAVENOUS | Status: AC
  Filled 2014-03-01: qty 50

## 2014-03-01 MED ORDER — HYDROMORPHONE HCL 1 MG/ML IJ SOLN
0.2500 mg | INTRAMUSCULAR | Status: DC | PRN
  Administered 2014-03-01: 0.5 mg via INTRAVENOUS

## 2014-03-01 MED ORDER — SIMETHICONE 80 MG PO CHEW: 80.0000 mg | CHEWABLE_TABLET | ORAL | Status: DC | PRN

## 2014-03-01 MED ORDER — CEFAZOLIN SODIUM-DEXTROSE 2-3 GM-% IV SOLR
2.0000 g | Freq: Once | INTRAVENOUS | Status: AC
  Administered 2014-03-01: 2 g via INTRAVENOUS

## 2014-03-01 MED ORDER — SODIUM CHLORIDE 0.9 % IJ SOLN: 3.0000 mL | INTRAMUSCULAR | Status: DC | PRN

## 2014-03-01 MED ORDER — OXYTOCIN 40 UNITS IN LACTATED RINGERS INFUSION - SIMPLE MED: 62.5000 mL/h | INTRAVENOUS | Status: AC

## 2014-03-01 MED ORDER — SCOPOLAMINE 1 MG/3DAYS TD PT72: 1.0000 | MEDICATED_PATCH | Freq: Once | TRANSDERMAL | Status: DC

## 2014-03-01 MED ORDER — DIPHENHYDRAMINE HCL 25 MG PO CAPS: 25.0000 mg | ORAL_CAPSULE | Freq: Four times a day (QID) | ORAL | Status: DC | PRN

## 2014-03-01 MED ORDER — IBUPROFEN 600 MG PO TABS
600.0000 mg | ORAL_TABLET | Freq: Four times a day (QID) | ORAL | Status: DC
  Administered 2014-03-01 – 2014-03-04 (×13): 600 mg via ORAL
  Filled 2014-03-01 (×13): qty 1

## 2014-03-01 MED ORDER — LANOLIN HYDROUS EX OINT: 1.0000 "application " | TOPICAL_OINTMENT | CUTANEOUS | Status: DC | PRN

## 2014-03-01 MED ORDER — SIMETHICONE 80 MG PO CHEW
80.0000 mg | CHEWABLE_TABLET | Freq: Three times a day (TID) | ORAL | Status: DC
  Administered 2014-03-01 – 2014-03-04 (×9): 80 mg via ORAL
  Filled 2014-03-01 (×9): qty 1

## 2014-03-01 MED ORDER — DIPHENHYDRAMINE HCL 25 MG PO CAPS: 25.0000 mg | ORAL_CAPSULE | ORAL | Status: DC | PRN

## 2014-03-01 MED ORDER — MORPHINE SULFATE (PF) 0.5 MG/ML IJ SOLN
INTRAMUSCULAR | Status: DC | PRN
  Administered 2014-03-01 (×2): 1 mg via INTRAVENOUS
  Administered 2014-03-01: 3 mg via EPIDURAL

## 2014-03-01 MED ORDER — METHYLERGONOVINE MALEATE 0.2 MG PO TABS: 0.2000 mg | ORAL_TABLET | ORAL | Status: DC | PRN

## 2014-03-01 MED ORDER — LACTATED RINGERS IV SOLN: INTRAVENOUS | Status: DC

## 2014-03-01 MED ORDER — MORPHINE SULFATE 0.5 MG/ML IJ SOLN
INTRAMUSCULAR | Status: AC
  Filled 2014-03-01: qty 10

## 2014-03-01 MED ORDER — KETOROLAC TROMETHAMINE 30 MG/ML IJ SOLN
INTRAMUSCULAR | Status: AC
  Administered 2014-03-01: 30 mg via INTRAVENOUS
  Filled 2014-03-01: qty 1

## 2014-03-01 MED ORDER — LIDOCAINE-EPINEPHRINE (PF) 2 %-1:200000 IJ SOLN
INTRAMUSCULAR | Status: AC
  Filled 2014-03-01: qty 20

## 2014-03-01 MED ORDER — OXYTOCIN 10 UNIT/ML IJ SOLN
INTRAMUSCULAR | Status: AC
  Filled 2014-03-01: qty 4

## 2014-03-01 MED ORDER — ZOLPIDEM TARTRATE 5 MG PO TABS: 5.0000 mg | ORAL_TABLET | Freq: Every evening | ORAL | Status: DC | PRN

## 2014-03-01 MED ORDER — OXYCODONE-ACETAMINOPHEN 5-325 MG PO TABS
1.0000 | ORAL_TABLET | ORAL | Status: DC | PRN
  Administered 2014-03-01 – 2014-03-04 (×9): 1 via ORAL
  Filled 2014-03-01 (×9): qty 1

## 2014-03-01 SURGICAL SUPPLY — 42 items
APL SKNCLS STERI-STRIP NONHPOA (GAUZE/BANDAGES/DRESSINGS) ×1
BARRIER ADHS 3X4 INTERCEED (GAUZE/BANDAGES/DRESSINGS) ×2 IMPLANT
BENZOIN TINCTURE PRP APPL 2/3 (GAUZE/BANDAGES/DRESSINGS) ×3 IMPLANT
BRR ADH 4X3 ABS CNTRL BYND (GAUZE/BANDAGES/DRESSINGS) ×1
CLAMP CORD UMBIL (MISCELLANEOUS) IMPLANT
CLOSURE WOUND 1/2 X4 (GAUZE/BANDAGES/DRESSINGS) ×1
CLOTH BEACON ORANGE TIMEOUT ST (SAFETY) ×3 IMPLANT
CONTAINER PREFILL 10% NBF 15ML (MISCELLANEOUS) IMPLANT
DRAPE SHEET LG 3/4 BI-LAMINATE (DRAPES) IMPLANT
DRSG OPSITE POSTOP 4X10 (GAUZE/BANDAGES/DRESSINGS) ×3 IMPLANT
DURAPREP 26ML APPLICATOR (WOUND CARE) ×3 IMPLANT
ELECT REM PT RETURN 9FT ADLT (ELECTROSURGICAL) ×3
ELECTRODE REM PT RTRN 9FT ADLT (ELECTROSURGICAL) ×1 IMPLANT
EXTRACTOR VACUUM M CUP 4 TUBE (SUCTIONS) IMPLANT
EXTRACTOR VACUUM M CUP 4' TUBE (SUCTIONS)
GLOVE BIOGEL M 6.5 STRL (GLOVE) ×6 IMPLANT
GLOVE BIOGEL PI IND STRL 6.5 (GLOVE) ×1 IMPLANT
GLOVE BIOGEL PI INDICATOR 6.5 (GLOVE) ×2
GOWN STRL REUS W/TWL LRG LVL3 (GOWN DISPOSABLE) ×9 IMPLANT
KIT ABG SYR 3ML LUER SLIP (SYRINGE) IMPLANT
NDL HYPO 25X5/8 SAFETYGLIDE (NEEDLE) IMPLANT
NEEDLE HYPO 25X5/8 SAFETYGLIDE (NEEDLE) IMPLANT
NS IRRIG 1000ML POUR BTL (IV SOLUTION) ×3 IMPLANT
PACK C SECTION WH (CUSTOM PROCEDURE TRAY) ×3 IMPLANT
PAD ABD 7.5X8 STRL (GAUZE/BANDAGES/DRESSINGS) ×2 IMPLANT
PAD OB MATERNITY 4.3X12.25 (PERSONAL CARE ITEMS) ×3 IMPLANT
RTRCTR C-SECT PINK 25CM LRG (MISCELLANEOUS) IMPLANT
RTRCTR C-SECT PINK 34CM XLRG (MISCELLANEOUS) IMPLANT
STAPLER VISISTAT 35W (STAPLE) IMPLANT
STRIP CLOSURE SKIN 1/2X4 (GAUZE/BANDAGES/DRESSINGS) ×2 IMPLANT
SUT PDS AB 0 CT1 27 (SUTURE) ×8 IMPLANT
SUT PLAIN 0 NONE (SUTURE) IMPLANT
SUT VIC AB 0 CTX 36 (SUTURE) ×12
SUT VIC AB 0 CTX36XBRD ANBCTRL (SUTURE) ×3 IMPLANT
SUT VIC AB 2-0 CT1 27 (SUTURE) ×6
SUT VIC AB 2-0 CT1 TAPERPNT 27 (SUTURE) ×1 IMPLANT
SUT VIC AB 3-0 SH 27 (SUTURE)
SUT VIC AB 3-0 SH 27X BRD (SUTURE) IMPLANT
SUT VIC AB 4-0 KS 27 (SUTURE) ×5 IMPLANT
TOWEL OR 17X24 6PK STRL BLUE (TOWEL DISPOSABLE) ×3 IMPLANT
TRAY FOLEY CATH 14FR (SET/KITS/TRAYS/PACK) ×3 IMPLANT
WATER STERILE IRR 1000ML POUR (IV SOLUTION) ×3 IMPLANT

## 2014-03-01 NOTE — Progress Notes (Signed)
  Subjective: Sleeping, yet easily aroused. Feels pressure w/ ctxs when awake.  Objective: BP 107/48 mmHg  Pulse 63  Temp(Src) 98.4 F (36.9 C) (Oral)  Resp 18  Ht 4\' 11"  (1.499 m)  Wt 182 lb (82.555 kg)  BMI 36.74 kg/m2  SpO2 98%  LMP 05/18/2013 I/O last 3 completed shifts: In: -  Out: 200 [Emesis/NG output:200]    FHT: BL 130 w/ mod variability, +accels, earlys UC:   irregular, every 3-4 minutes SVE:   4/90/-1 Pitocin at 10 mius/min MVUs greater than 180 for past 2 hrs  Assessment:  IUP at 41.4 wks Cat 1 FHRT No cervical change despite 2 hrs of adequate MVUs  Plan: Discussed findings w/ pt and recommended c-section due to above. Pt. Tearful and requests a little time with family. Will update Dr. Tildon Husky, [place] 03/01/2014, 1:43 AM

## 2014-03-01 NOTE — Progress Notes (Signed)
  Subjective: Sleeping, yet easily aroused. Zofran effective. Family at bedside.  Objective: BP 99/56 mmHg  Pulse 67  Temp(Src) 98.4 F (36.9 C) (Oral)  Resp 18  Ht 4\' 11"  (1.499 m)  Wt 182 lb (82.555 kg)  BMI 36.74 kg/m2  SpO2 98%  LMP 05/18/2013 I/O last 3 completed shifts: In: -  Out: 200 [Emesis/NG output:200]    FHT: BL 130 w/ mod variability, +accels, earlys UC:   irregular, every 2-4 minutes SVE:   Dilation: 4 Effacement (%): 90 Station: -2 Exam by:: Farrel Gordon, CNM Pitocin at 9 mius/min Periods of adequate/inadequate MVUs Adequate uop  Assessment:  IUP at 41.3 wks IOL due to late term pregnancy Cat 1 FHRT No cervical change since 1803  Plan: Dr. Landry Mellow updated Continue w/ current management plan for now Re-evaluate in 2 hrs  Farrel Gordon CNM 02/28/14, 23:41 PM

## 2014-03-01 NOTE — Plan of Care (Signed)
Problem: Phase I Progression Outcomes Goal: Initial discharge plan identified Outcome: Completed/Met Date Met:  03/01/14

## 2014-03-01 NOTE — Transfer of Care (Signed)
Immediate Anesthesia Transfer of Care Note  Patient: Lindsey Beard  Procedure(s) Performed: Procedure(s): CESAREAN SECTION (N/A)  Patient Location: PACU  Anesthesia Type:Epidural  Level of Consciousness: awake, alert  and oriented  Airway & Oxygen Therapy: Patient Spontanous Breathing  Post-op Assessment: Report given to PACU RN and Post -op Vital signs reviewed and stable  Post vital signs: Reviewed and stable  Complications: No apparent anesthesia complications

## 2014-03-01 NOTE — Op Note (Addendum)
Cesarean Section Procedure Note  Indications: failure to progress: arrest of dilation  Pre-operative Diagnosis: 41 week 4 day pregnancy.  Post-operative Diagnosis: same  Surgeon: Catha Brow.   Assistants: Farrel Gordon CNM  Anesthesia: Epidural anesthesia  ASA Class: 2   Procedure Details   The patient was seen in the Holding Room. The risks, benefits, complications, treatment options, and expected outcomes were discussed with the patient.  The patient concurred with the proposed plan, giving informed consent.  The site of surgery properly noted/marked. The patient was taken to Operating Room # 9, identified as Lindsey Beard and the procedure verified as C-Section Delivery. A Time Out was held and the above information confirmed.  After induction of anesthesia, the patient was draped and prepped in the usual sterile manner. A Pfannenstiel incision was made and carried down through the subcutaneous tissue to the fascia. Fascial incision was made and extended transversely. The fascia was separated from the underlying rectus tissue superiorly and inferiorly. The peritoneum was identified and entered. Peritoneal incision was extended longitudinally. The utero-vesical peritoneal reflection was incised transversely and the bladder flap was bluntly freed from the lower uterine segment. A low transverse uterine incision was made. Delivered from cephalic presentation was a Female with Apgar scores of 8 at one minute and 9 at five minutes. After the umbilical cord was clamped and cut cord blood was obtained for evaluation. The placenta was removed intact and appeared normal. The uterine outline, tubes and ovaries appeared normal. The uterine incision was closed with running locked sutures of O vicryl . A second layer of 0 vciryl was used to imbricate the incision.pt was noted to have uterine atony and Methergine 0.2 mg was injected directly into the uterus.  Hemostasis was observed. Lavage was  carried out until clear.  Interseed was placed along the uterine incision. The peritonieum was closed with 2-0 vicryl The fascia was then reapproximated with running sutures of 0 vicryl . The skin was reapproximated with 4-0 vicryl .  Instrument, sponge, and needle counts were correct prior the abdominal closure and at the conclusion of the case.   Findings: Female infant in the cephalic presentation. Occiput posterior position.. Normal fallopian tubes and ovaries.   Estimated Blood Loss:  500 ML         Drains: Foley UOP 150 ML           Total IV Fluids:  Per anesthesia ml         Specimens: Placenta and sent to labor and delivery            Implants: None         Complications:  None; patient tolerated the procedure well.         Disposition: PACU - hemodynamically stable.         Condition: stable   Attending Attestation: I performed the procedure.

## 2014-03-01 NOTE — Plan of Care (Signed)
Problem: Phase I Progression Outcomes Goal: Pain controlled with appropriate interventions Outcome: Completed/Met Date Met:  03/01/14 Goal: OOB as tolerated unless otherwise ordered Outcome: Completed/Met Date Met:  03/01/14 Goal: VS, stable, temp < 100.4 degrees F Outcome: Completed/Met Date Met:  03/01/14  Problem: Phase II Progression Outcomes Goal: Pain controlled on oral analgesia Outcome: Completed/Met Date Met:  03/01/14 Goal: Progress activity as tolerated unless otherwise ordered Outcome: Completed/Met Date Met:  03/01/14 Goal: Afebrile, VS remain stable Outcome: Completed/Met Date Met:  03/01/14 Goal: Rh isoimmunization per orders Outcome: Completed/Met Date Met:  03/01/14

## 2014-03-01 NOTE — Plan of Care (Signed)
Problem: Phase I Progression Outcomes Goal: IS, TCDB as ordered Outcome: Completed/Met Date Met:  03/01/14     

## 2014-03-01 NOTE — Plan of Care (Signed)
Problem: Phase I Progression Outcomes Goal: Foley catheter patent Outcome: Completed/Met Date Met:  03/01/14

## 2014-03-01 NOTE — Anesthesia Postprocedure Evaluation (Signed)
Anesthesia Post Note  Patient: Lindsey Beard  Procedure(s) Performed: Procedure(s) (LRB): CESAREAN SECTION (N/A)  Anesthesia type: Epidural  Patient location: PACU  Post pain: Pain level controlled  Post assessment: Post-op Vital signs reviewed  Last Vitals: BP 111/52 mmHg  Pulse 56  Temp(Src) 37.2 C (Oral)  Resp 16  Ht 4\' 11"  (1.499 m)  Wt 182 lb (82.555 kg)  BMI 36.74 kg/m2  SpO2 96%  LMP 05/18/2013  Breastfeeding? Unknown  Post vital signs: Reviewed  Level of consciousness: sedated  Complications: No apparent anesthesia complications

## 2014-03-01 NOTE — Progress Notes (Signed)
  Subjective: Comfortable with epidural.  Objective: VSS, afebrile  FHT: Category 1 UC:   regular, every 2-4 minutes SVE:   4 cm, 90%, vtx, -2--no change Pitocin at 5 mu/min MVUs 190  Assessment:  Minimal progress in early labor.  Plan: Continue pitocin augmentation Close observation of labor progress. Position change to facilitate rotation/descent  Donnel Saxon CNM 02/28/14 6p

## 2014-03-01 NOTE — Plan of Care (Signed)
Problem: Consults Goal: Postpartum Patient Education (See Patient Education module for education specifics.)  Outcome: Completed/Met Date Met:  03/01/14     

## 2014-03-01 NOTE — Addendum Note (Signed)
Addendum  created 03/01/14 1636 by Jonna Munro, CRNA   Modules edited: Notes Section   Notes Section:  File: [id number]

## 2014-03-01 NOTE — Progress Notes (Signed)
Pt wishes to proceed with c-section and will accept blood products in the event of an emergency. Dr. Landry Mellow notified.  Farrel Gordon, CNM 03/01/14 @ 2:06 AM

## 2014-03-01 NOTE — Anesthesia Postprocedure Evaluation (Signed)
  Anesthesia Post-op Note  Patient: Lindsey Beard  Procedure(s) Performed: Procedure(s): CESAREAN SECTION (N/A)  Patient Location: Mother/Baby  Anesthesia Type:Epidural  Level of Consciousness: awake, alert  and oriented  Airway and Oxygen Therapy: Patient Spontanous Breathing  Post-op Pain: none  Post-op Assessment: Post-op Vital signs reviewed, Patient's Cardiovascular Status Stable, Respiratory Function Stable, Pain level controlled, No headache, No backache, No residual numbness and No residual motor weakness  Post-op Vital Signs: Reviewed and stable  Last Vitals:  Filed Vitals:   03/01/14 1454  BP: 113/45  Pulse: 72  Temp: 36.8 C  Resp: 20    Complications: No apparent anesthesia complications

## 2014-03-01 NOTE — Progress Notes (Addendum)
Lindsey Beard is a 23 y.o. G2P0010 at [redacted]w[redacted]d  admitted for induction of labor due to Post dates. Due date 02/18/2014.  Subjective: In to assess patient due to arrest of dilatation at 4 cm with adequate MVU's   Objective: BP 107/48 mmHg  Pulse 63  Temp(Src) 98.4 F (36.9 C) (Oral)  Resp 18  Ht 4\' 11"  (1.499 m)  Wt 82.555 kg (182 lb)  BMI 36.74 kg/m2  SpO2 98%  LMP 05/18/2013 I/O last 3 completed shifts: In: -  Out: 200 [Emesis/NG output:200]    FHT:  FHR: 120 bpm, variability: moderate,  accelerations:  Present,  decelerations:  Absent UC:   regular, every 2 minutes SVE:   Dilation: 4 Effacement (%): 90 Station: -2 Exam by:: Farrel Gordon, CNM  Labs: Lab Results  Component Value Date   WBC 7.8 02/27/2014   HGB 11.5* 02/27/2014   HCT 33.8* 02/27/2014   MCV 88.3 02/27/2014   PLT 245 02/27/2014    Assessment / Plan: Arrest in active phase of labor.. Recommend cesarean section. R/B/A of cesarean section discussed with patient including but not limited to infection bleeding damage to bowel bladder baby with the need for further surgery. R/O tranfusion HIV, Hep B&C discussed. Pt voiced understanding and desires to proceed with primary cesarean section due to arrest of dilatation.   Buford Gayler J. 03/01/2014, 2:19 AM

## 2014-03-01 NOTE — Progress Notes (Addendum)
Subjective: Postpartum Day 0: Cesarean Delivery due to FTP Patient stood at bedside, reports no syncope or dizziness. Feeding:  Breast Contraceptive plan:  Undecided Foley still in place  Objective: Vital signs in last 24 hours: Temp:  [97.5 F (36.4 C)-99.2 F (37.3 C)] 98.1 F (36.7 C) (11/15 0838) Pulse Rate:  [52-131] 70 (11/15 0845) Resp:  [12-29] 20 (11/15 0838) BP: (97-126)/(37-80) 104/48 mmHg (11/15 0845) SpO2:  [94 %-99 %] 95 % (11/15 2458)  Physical Exam:  General: alert Lochia: appropriate Uterine Fundus: firm Abdomen:  + bowel sounds, mild gaseous distention Incision: Pressure dressing CDI DVT Evaluation: No evidence of DVT seen on physical exam.  Negative Homan's sign.  SCDs on. Foley draining clear urine.    Recent Labs  02/27/14 2050  HGB 11.5*  HCT 33.8*  WBC 7.8    Assessment/Plan: Status post Cesarean section day 0 Doing well postoperatively.  Continue current care     Donnel Saxon CNM, [place] 03/01/2014, 8:53 AM

## 2014-03-02 ENCOUNTER — Encounter (HOSPITAL_COMMUNITY): Payer: Self-pay | Admitting: Obstetrics and Gynecology

## 2014-03-02 LAB — CBC
HCT: 26.9 % — ABNORMAL LOW (ref 36.0–46.0)
HEMOGLOBIN: 9.1 g/dL — AB (ref 12.0–15.0)
MCH: 30.2 pg (ref 26.0–34.0)
MCHC: 33.8 g/dL (ref 30.0–36.0)
MCV: 89.4 fL (ref 78.0–100.0)
Platelets: 186 10*3/uL (ref 150–400)
RBC: 3.01 MIL/uL — AB (ref 3.87–5.11)
RDW: 14.7 % (ref 11.5–15.5)
WBC: 13.2 10*3/uL — ABNORMAL HIGH (ref 4.0–10.5)

## 2014-03-02 NOTE — Progress Notes (Signed)
UR chart review completed.  

## 2014-03-02 NOTE — Lactation Note (Signed)
This note was copied from the chart of Lindsey Beard. Lactation Consultation Note  Helped mom with a deeper latch.  She reported increased comfort and the feeding time decreased.  Repeated on the left side and baby continued to suckle.  Patient Name: Lindsey Beard Date: 03/02/2014 Reason for consult: Initial assessment   Maternal Data    Feeding Feeding Type: Breast Fed Length of feed: 10 min  LATCH Score/Interventions Latch: Repeated attempts needed to sustain latch, nipple held in mouth throughout feeding, stimulation needed to elicit sucking reflex.  Audible Swallowing: Spontaneous and intermittent  Type of Nipple: Everted at rest and after stimulation  Comfort (Breast/Nipple): Soft / non-tender     Hold (Positioning): Assistance needed to correctly position infant at breast and maintain latch.  LATCH Score: 8  Lactation Tools Discussed/Used Date initiated:: 03/03/14   Consult Status Consult Status: Follow-up    Van Clines 03/02/2014, 6:08 PM

## 2014-03-02 NOTE — Progress Notes (Addendum)
Subjective: Postpartum Day 1: Cesarean Delivery due to FTP Patient up ad lib, reports no syncope or dizziness. Feeding:  breastfeeding Contraceptive plan:  Mirena  Objective: Vital signs in last 24 hours: Temp:  [97.6 F (36.4 C)-98.3 F (36.8 C)] 97.8 F (36.6 C) (11/16 0600) Pulse Rate:  [52-78] 52 (11/16 0600) Resp:  [18-20] 18 (11/16 0600) BP: (91-113)/(33-56) 100/40 mmHg (11/16 0600) SpO2:  [97 %-100 %] 100 % (11/16 0228)  Physical Exam:  General: alert and cooperative Lochia: appropriate Uterine Fundus: firm Abdomen:  + bowel sounds, non distended Incision: healing well  Outer DSG CDI, Honeycomb dressing with some drainage DVT Evaluation: No evidence of DVT seen on physical exam. Homan's sign: Negative   Recent Labs  02/27/14 2050 03/02/14 0555  HGB 11.5* 9.1*  HCT 33.8* 26.9*  WBC 7.8 13.2*    Assessment: Status post Cesarean section day 1. Doing well postoperatively.  Remove outer dressing Honeycomb dressing in place, note drainage Anemia - hemodynamicly stable.   Plan: Continue current care. Breastfeeding and Lactation consult     Marry Kusch, CNM, MSN 03/02/2014. 8:42 AM

## 2014-03-03 LAB — CBC
HCT: 28.2 % — ABNORMAL LOW (ref 36.0–46.0)
Hemoglobin: 9.6 g/dL — ABNORMAL LOW (ref 12.0–15.0)
MCH: 30.5 pg (ref 26.0–34.0)
MCHC: 34 g/dL (ref 30.0–36.0)
MCV: 89.5 fL (ref 78.0–100.0)
PLATELETS: 205 10*3/uL (ref 150–400)
RBC: 3.15 MIL/uL — ABNORMAL LOW (ref 3.87–5.11)
RDW: 14.9 % (ref 11.5–15.5)
WBC: 9.8 10*3/uL (ref 4.0–10.5)

## 2014-03-03 MED ORDER — IBUPROFEN 600 MG PO TABS: 600.0000 mg | ORAL_TABLET | Freq: Four times a day (QID) | ORAL | Status: DC

## 2014-03-03 MED ORDER — OXYCODONE-ACETAMINOPHEN 5-325 MG PO TABS: 1.0000 | ORAL_TABLET | ORAL | Status: DC | PRN

## 2014-03-03 NOTE — Progress Notes (Signed)
Care nurse called to report the pt changed her mind about being disacharged.  She wants to starvand be discharged tomorrow.

## 2014-03-03 NOTE — Lactation Note (Signed)
This note was copied from the chart of Lindsey Beard. Lactation Consultation Note  Patient Name: Lindsey Kennesha Brewbaker [id number] Date: 03/03/2014   [facility] informed by the dayshift and the evening nurses that they had assisted mom with latching and that latch was improving.  Most recent LATCH score=7, previous LATCH assessment=8 and baby meeting output guidelines and exclusively breastfeeding for 10-60 minutes per feeding.  Mom was resting at time of attempted [facility] visit.  Maternal Data    Feeding Feeding Type: Breast Milk  LATCH Score/Interventions           most recent LATCH score=7 per RN assessment           Lactation Tools Discussed/Used   Visit deferred  Consult Status   LC follow-up per patient request tonight and in am   Bernita Buffy 03/03/2014, 11:19 PM

## 2014-03-03 NOTE — Discharge Summary (Addendum)
Cesarean Section Delivery Discharge Summary  Lindsey Beard  DOB:    1991/04/10 MRN:    [id number] CSN:    [id number]  Date of admission:                  02/27/14  Date of discharge:                   03/03/14  Procedures this admission:  Induction of labor, primary LTCS for FTP  Date of Delivery: 03/01/14  Newborn Data:  Live born  Information for the patient's newborn:  Sivan, Cuello [[id number]]  female  Live born female  Birth Weight: 7 lb 13.9 oz (3570 g) APGAR: 8, 9  Home with mother. Name: Skylar  History of Present Illness:  Lindsey Beard is a 23 y.o. female, G2P1011, who presents at [redacted]w[redacted]d weeks gestation. The patient has been followed at the [facility] and Gynecology division of [facility].    Her pregnancy has been complicated by:  Patient Active Problem List   Diagnosis Date Noted  . Itching 02/16/2014  . Short stature 02/16/2014  . 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   Cytotech was utilized, then pitocin.  She received epidural anesthesia.  She progressed to 4 cm, but did not progress further despite adequate labor. She was delivered by Dr. Landry Mellow for [place], under existing epidural anesthesia.  Her postpartum course was not complicatedShe was discharged to home on postpartum day 3, doing well.  Feeding: Breast--pumping due to difficult latch  Contraception: IUD, Mirena Pt understands the risks are but not limited to irregular bleeding, formation of DVT, fluid fluctuations, elevation in blood pressure, stroke, breast tenderness and liver damage.  She states she will report any serious side effects.  She has been given verbal and written instructions and voiced a clear understanding.    Discharge hemoglobin: HEMOGLOBIN  Date Value Ref Range Status  03/03/2014 9.6* 12.0 - 15.0 g/dL Final   HCT  Date Value Ref Range Status  03/03/2014  28.2* 36.0 - 46.0 % Final   Anemia - hemodynamicly stable.    PreNatal Labs ABO, Rh: --/--/O POS (11/13 2050)   Antibody: NEG (11/13 2050) Rubella:   immune RPR: NON REAC (11/13 2050)  HBsAg: Negative (03/27 0000)  HIV: NONREACTIVE (11/13 2050)  GBS: Negative (10/07 0000)  Discharge Physical Exam:  General: alert and cooperative Lochia: appropriate Uterine Fundus: firm Incision: healing well DVT Evaluation: No evidence of DVT seen on physical exam.  Intrapartum Procedures: cesarean: low cervical, transverse Postpartum Procedures: none Complications-Operative and Postpartum: none  Discharge Diagnoses: Term Pregnancy-delivered  Discharge Information:  Activity:           pelvic rest Diet:                routine Medications: PNV, Ibuprofen, Iron and Percocet Condition:      stable   Discharge to: home  Follow-up Information    Follow up with [facility] In 2 weeks.   Specialty:  Obstetrics and Gynecology   Why:  For wound re-check   Contact information:   [address] [place] [address] [phone]      Follow up with [facility] In 5 weeks.   Specialty:  Obstetrics and Gynecology   Why:  Postpartum check up   Contact information:   [address]  [address] [place] [address] [place], CNM, MSN  03/03/2014. 8:29 AM  Care After Cesarean Delivery  Refer to this sheet in the next few weeks. These instructions provide you with information on caring for yourself after your procedure. Your caregiver may also give you specific instructions. Your treatment has been planned according to current medical practices, but problems sometimes occur. Call your caregiver if you have any problems or questions after you go home. HOME CARE INSTRUCTIONS  Only take over-the-counter or prescription medicines as directed by your  caregiver.  Do not drink alcohol, especially if you are breastfeeding or taking medicine to relieve pain.  Do not chew or smoke tobacco.  Continue to use good perineal care. Good perineal care includes:  Wiping your perineum from front to back.  Keeping your perineum clean.  Check your cut (incision) daily for increased redness, drainage, swelling, or separation of skin.  Clean your incision gently with soap and water every day, and then pat it dry. If your caregiver says it is okay, leave the incision uncovered. Use a bandage (dressing) if the incision is draining fluid or appears irritated. If the adhesive strips across the incision do not fall off within 7 days, carefully peel them off.  Hug a pillow when coughing or sneezing until your incision is healed. This helps to relieve pain.  Do not use tampons or douche until your caregiver says it is okay.  Shower, wash your hair, and take tub baths as directed by your caregiver.  Wear a well-fitting bra that provides breast support.  Limit wearing support panties or control-top hose.  Drink enough fluids to keep your urine clear or pale yellow.  Eat high-fiber foods such as whole grain cereals and breads, brown rice, beans, and fresh fruits and vegetables every day. These foods may help prevent or relieve constipation.  Resume activities such as climbing stairs, driving, lifting, exercising, or traveling as directed by your caregiver.  Talk to your caregiver about resuming sexual activities. This is dependent upon your risk of infection, your rate of healing, and your comfort and desire to resume sexual activity.  Try to have someone help you with your household activities and your newborn for at least a few days after you leave the hospital.  Rest as much as possible. Try to rest or take a nap when your newborn is sleeping.  Increase your activities gradually.  Keep all of your scheduled postpartum appointments. It is very  important to keep your scheduled follow-up appointments. At these appointments, your caregiver will be checking to make sure that you are healing physically and emotionally. SEEK MEDICAL CARE IF:   You are passing large clots from your vagina. Save any clots to show your caregiver.  You have a foul smelling discharge from your vagina.  You have trouble urinating.  You are urinating frequently.  You have pain when you urinate.  You have a change in your bowel movements.  You have increasing redness, pain, or swelling near your incision.  You have pus draining from your incision.  Your incision is separating.  You have painful, hard, or reddened breasts.  You have a severe headache.  You have blurred vision or see spots.  You feel sad or depressed.  You have thoughts of hurting yourself or your newborn.  You have questions about your care, the care of your newborn, or medicines.  You are dizzy or lightheaded.  You have a  rash.  You have pain, redness, or swelling at the site of the removed intravenous access (IV) tube.  You have nausea or vomiting.  You stopped breastfeeding and have not had a menstrual period within 12 weeks of stopping.  You are not breastfeeding and have not had a menstrual period within 12 weeks of delivery.  You have a fever. SEEK IMMEDIATE MEDICAL CARE IF:  You have persistent pain.  You have chest pain.  You have shortness of breath.  You faint.  You have leg pain.  You have stomach pain.  Your vaginal bleeding saturates 2 or more sanitary pads in 1 hour. MAKE SURE YOU:   Understand these instructions.  Will watch your condition.  Will get help right away if you are not doing well or get worse. Document Released: 12/24/2001 Document Revised: 12/27/2011 Document Reviewed: 11/29/2011 [facility] Patient Information 2014 [place].   Postpartum Depression and Baby Blues  The postpartum period begins right after the birth  of a baby. During this time, there is often a great amount of joy and excitement. It is also a time of considerable changes in the life of the parent(s). Regardless of how many times a mother gives birth, each child brings new challenges and dynamics to the family. It is not unusual to have feelings of excitement accompanied by confusing shifts in moods, emotions, and thoughts. All mothers are at risk of developing postpartum depression or the "baby blues." These mood changes can occur right after giving birth, or they may occur many months after giving birth. The baby blues or postpartum depression can be mild or severe. Additionally, postpartum depression can resolve rather quickly, or it can be a long-term condition. CAUSES Elevated hormones and their rapid decline are thought to be a main cause of postpartum depression and the baby blues. There are a number of hormones that radically change during and after pregnancy. Estrogen and progesterone usually decrease immediately after delivering your baby. The level of thyroid hormone and various cortisol steroids also rapidly drop. Other factors that play a major role in these changes include major life events and genetics.  RISK FACTORS If you have any of the following risks for the baby blues or postpartum depression, know what symptoms to watch out for during the postpartum period. Risk factors that may increase the likelihood of getting the baby blues or postpartum depression include: 1. Havinga personal or family history of depression. 2. Having depression while being pregnant. 3. Having premenstrual or oral contraceptive-associated mood issues. 4. Having exceptional life stress. 5. Having marital conflict. 6. Lacking a social support network. 7. Having a baby with special needs. 8. Having health problems such as diabetes. SYMPTOMS Baby blues symptoms include:  Brief fluctuations in mood, such as going from extreme happiness to  sadness.  Decreased concentration.  Difficulty sleeping.  Crying spells, tearfulness.  Irritability.  Anxiety. Postpartum depression symptoms typically begin within the first month after giving birth. These symptoms include:  Difficulty sleeping or excessive sleepiness.  Marked weight loss.  Agitation.  Feelings of worthlessness.  Lack of interest in activity or food. Postpartum psychosis is a very concerning condition and can be dangerous. Fortunately, it is rare. Displaying any of the following symptoms is cause for immediate medical attention. Postpartum psychosis symptoms include:  Hallucinations and delusions.  Bizarre or disorganized behavior.  Confusion or disorientation. DIAGNOSIS  A diagnosis is made by an evaluation of your symptoms. There are no medical or lab tests that lead to a diagnosis,  but there are various questionnaires that a caregiver may use to identify those with the baby blues, postpartum depression, or psychosis. Often times, a screening tool called the [place] Postnatal Depression Scale is used to diagnose depression in the postpartum period.  TREATMENT The baby blues usually goes away on its own in 1 to 2 weeks. Social support is often all that is needed. You should be encouraged to get adequate sleep and rest. Occasionally, you may be given medicines to help you sleep.  Postpartum depression requires treatment as it can last several months or longer if it is not treated. Treatment may include individual or group therapy, medicine, or both to address any social, physiological, and psychological factors that may play a role in the depression. Regular exercise, a healthy diet, rest, and social support may also be strongly recommended.  Postpartum psychosis is more serious and needs treatment right away. Hospitalization is often needed. HOME CARE INSTRUCTIONS  Get as much rest as you can. Nap when the baby sleeps.  Exercise regularly. Some women find  yoga and walking to be beneficial.  Eat a balanced and nourishing diet.  Do little things that you enjoy. Have a cup of tea, take a bubble bath, read your favorite magazine, or listen to your favorite music.  Avoid alcohol.  Ask for help with household chores, cooking, grocery shopping, or running errands as needed. Do not try to do everything.  Talk to people close to you about how you are feeling. Get support from your partner, family members, friends, or other new moms.  Try to stay positive in how you think. Think about the things you are grateful for.  Do not spend a lot of time alone.  Only take medicine as directed by your caregiver.  Keep all your postpartum appointments.  Let your caregiver know if you have any concerns. SEEK MEDICAL CARE IF: You are having a reaction or problems with your medicine. SEEK IMMEDIATE MEDICAL CARE IF:  You have suicidal feelings.  You feel you may harm the baby or someone else. Document Released: 01/06/2004 Document Revised: 06/26/2011 Document Reviewed: 02/07/2011 [facility] Patient Information 2014 [place], [place].   Breastfeeding Deciding to breastfeed is one of the best choices you can make for you and your baby. A change in hormones during pregnancy causes your breast tissue to grow and increases the number and size of your milk ducts. These hormones also allow proteins, sugars, and fats from your blood supply to make breast milk in your milk-producing glands. Hormones prevent breast milk from being released before your baby is born as well as prompt milk flow after birth. Once breastfeeding has begun, thoughts of your baby, as well as his or her sucking or crying, can stimulate the release of milk from your milk-producing glands.  BENEFITS OF BREASTFEEDING For Your Baby  Your first milk (colostrum) helps your baby's digestive system function better.   There are antibodies in your milk that help your baby fight off infections.    Your baby has a lower incidence of asthma, allergies, and sudden infant death syndrome.   The nutrients in breast milk are better for your baby than infant formulas and are designed uniquely for your baby's needs.   Breast milk improves your baby's brain development.   Your baby is less likely to develop other conditions, such as childhood obesity, asthma, or type 2 diabetes mellitus.  For You   Breastfeeding helps to create a very special bond between you and your baby.  Breastfeeding is convenient. Breast milk is always available at the correct temperature and costs nothing.   Breastfeeding helps to burn calories and helps you lose the weight gained during pregnancy.   Breastfeeding makes your uterus contract to its prepregnancy size faster and slows bleeding (lochia) after you give birth.   Breastfeeding helps to lower your risk of developing type 2 diabetes mellitus, osteoporosis, and breast or ovarian cancer later in life. SIGNS THAT YOUR BABY IS HUNGRY Early Signs of Hunger  Increased alertness or activity.  Stretching.  Movement of the head from side to side.  Movement of the head and opening of the mouth when the corner of the mouth or cheek is stroked (rooting).  Increased sucking sounds, smacking lips, cooing, sighing, or squeaking.  Hand-to-mouth movements.  Increased sucking of fingers or hands. Late Signs of Hunger  Fussing.  Intermittent crying. Extreme Signs of Hunger Signs of extreme hunger will require calming and consoling before your baby will be able to breastfeed successfully. Do not wait for the following signs of extreme hunger to occur before you initiate breastfeeding:   Restlessness.  A loud, strong cry.   Screaming.  BREASTFEEDING BASICS Breastfeeding Initiation  Find a comfortable place to sit or lie down, with your neck and back well supported.  Place a pillow or rolled up blanket under your baby to bring him or her to  the level of your breast (if you are seated). Nursing pillows are specially designed to help support your arms and your baby while you breastfeed.  Make sure that your baby's abdomen is facing your abdomen.   Gently massage your breast. With your fingertips, massage from your chest wall toward your nipple in a circular motion. This encourages milk flow. You may need to continue this action during the feeding if your milk flows slowly.  Support your breast with 4 fingers underneath and your thumb above your nipple. Make sure your fingers are well away from your nipple and your baby's mouth.   Stroke your baby's lips gently with your finger or nipple.   When your baby's mouth is open wide enough, quickly bring your baby to your breast, placing your entire nipple and as much of the colored area around your nipple (areola) as possible into your baby's mouth.   More areola should be visible above your baby's upper lip than below the lower lip.   Your baby's tongue should be between his or her lower gum and your breast.   Ensure that your baby's mouth is correctly positioned around your nipple (latched). Your baby's lips should create a seal on your breast and be turned out (everted).  It is common for your baby to suck about 2-3 minutes in order to start the flow of breast milk. Latching Teaching your baby how to latch on to your breast properly is very important. An improper latch can cause nipple pain and decreased milk supply for you and poor weight gain in your baby. Also, if your baby is not latched onto your nipple properly, he or she may swallow some air during feeding. This can make your baby fussy. Burping your baby when you switch breasts during the feeding can help to get rid of the air. However, teaching your baby to latch on properly is still the best way to prevent fussiness from swallowing air while breastfeeding. Signs that your baby has successfully latched on to your nipple:     Silent tugging or silent sucking, without causing you pain.  Swallowing heard between every 3-4 sucks.    Muscle movement above and in front of his or her ears while sucking.  Signs that your baby has not successfully latched on to nipple:   Sucking sounds or smacking sounds from your baby while breastfeeding.  Nipple pain. If you think your baby has not latched on correctly, slip your finger into the corner of your baby's mouth to break the suction and place it between your baby's gums. Attempt breastfeeding initiation again. Signs of Successful Breastfeeding Signs from your baby:   A gradual decrease in the number of sucks or complete cessation of sucking.   Falling asleep.   Relaxation of his or her body.   Retention of a small amount of milk in his or her mouth.   Letting go of your breast by himself or herself. Signs from you:  Breasts that have increased in firmness, weight, and size 1-3 hours after feeding.   Breasts that are softer immediately after breastfeeding.  Increased milk volume, as well as a change in milk consistency and color by the fifth day of breastfeeding.   Nipples that are not sore, cracked, or bleeding. Signs That Your [employer] is Getting Enough Milk  Wetting at least 3 diapers in a 24-hour period. The urine should be clear and pale yellow by age 61 days.  At least 3 stools in a 24-hour period by age 61 days. The stool should be soft and yellow.  At least 3 stools in a 24-hour period by age 6 days. The stool should be seedy and yellow.  No loss of weight greater than 10% of birth weight during the first 67 days of age.  Average weight gain of 4-7 ounces (113-198 g) per week after age 61 days.  Consistent daily weight gain by age 69 days, without weight loss after the age of 2 weeks. After a feeding, your baby may spit up a small amount. This is common. BREASTFEEDING FREQUENCY AND DURATION Frequent feeding will help you make more milk and  can prevent sore nipples and breast engorgement. Breastfeed when you feel the need to reduce the fullness of your breasts or when your baby shows signs of hunger. This is called "breastfeeding on demand." Avoid introducing a pacifier to your baby while you are working to establish breastfeeding (the first 4-6 weeks after your baby is born). After this time you may choose to use a pacifier. Research has shown that pacifier use during the first year of a baby's life decreases the risk of sudden infant death syndrome (SIDS). Allow your baby to feed on each breast as long as he or she wants. Breastfeed until your baby is finished feeding. When your baby unlatches or falls asleep while feeding from the first breast, offer the second breast. Because newborns are often sleepy in the first few weeks of life, you may need to awaken your baby to get him or her to feed. Breastfeeding times will vary from baby to baby. However, the following rules can serve as a guide to help you ensure that your baby is properly fed:  Newborns (babies 84 weeks of age or younger) may breastfeed every 1-3 hours.  Newborns should not go longer than 3 hours during the day or 5 hours during the night without breastfeeding.  You should breastfeed your baby a minimum of 8 times in a 24-hour period until you begin to introduce solid foods to your baby at around 69 months of age. BREAST MILK PUMPING Pumping  and storing breast milk allows you to ensure that your baby is exclusively fed your breast milk, even at times when you are unable to breastfeed. This is especially important if you are going back to work while you are still breastfeeding or when you are not able to be present during feedings. Your lactation consultant can give you guidelines on how long it is safe to store breast milk.  A breast pump is a machine that allows you to pump milk from your breast into a sterile bottle. The pumped breast milk can then be stored in a refrigerator  or freezer. Some breast pumps are operated by hand, while others use electricity. Ask your lactation consultant which type will work best for you. Breast pumps can be purchased, but some hospitals and breastfeeding support groups lease breast pumps on a monthly basis. A lactation consultant can teach you how to hand express breast milk, if you prefer not to use a pump.  CARING FOR YOUR BREASTS WHILE YOU BREASTFEED Nipples can become dry, cracked, and sore while breastfeeding. The following recommendations can help keep your breasts moisturized and healthy:  Avoid using soap on your nipples.   Wear a supportive bra. Although not required, special nursing bras and tank tops are designed to allow access to your breasts for breastfeeding without taking off your entire bra or top. Avoid wearing underwire-style bras or extremely tight bras.  Air dry your nipples for 3-74minutes after each feeding.   Use only cotton bra pads to absorb leaked breast milk. Leaking of breast milk between feedings is normal.   Use lanolin on your nipples after breastfeeding. Lanolin helps to maintain your skin's normal moisture barrier. If you use pure lanolin, you do not need to wash it off before feeding your baby again. Pure lanolin is not toxic to your baby. You may also hand express a few drops of breast milk and gently massage that milk into your nipples and allow the milk to air dry. In the first few weeks after giving birth, some women experience extremely full breasts (engorgement). Engorgement can make your breasts feel heavy, warm, and tender to the touch. Engorgement peaks within 3-5 days after you give birth. The following recommendations can help ease engorgement:  Completely empty your breasts while breastfeeding or pumping. You may want to start by applying warm, moist heat (in the shower or with warm water-soaked hand towels) just before feeding or pumping. This increases circulation and helps the milk flow.  If your baby does not completely empty your breasts while breastfeeding, pump any extra milk after he or she is finished.  Wear a snug bra (nursing or regular) or tank top for 1-2 days to signal your body to slightly decrease milk production.  Apply ice packs to your breasts, unless this is too uncomfortable for you.  Make sure that your baby is latched on and positioned properly while breastfeeding. If engorgement persists after 48 hours of following these recommendations, contact your health care provider or a Science writer. OVERALL HEALTH CARE RECOMMENDATIONS WHILE BREASTFEEDING  Eat healthy foods. Alternate between meals and snacks, eating 3 of each per day. Because what you eat affects your breast milk, some of the foods may make your baby more irritable than usual. Avoid eating these foods if you are sure that they are negatively affecting your baby.  Drink milk, fruit juice, and water to satisfy your thirst (about 10 glasses a day).   Rest often, relax, and continue to take your  prenatal vitamins to prevent fatigue, stress, and anemia.  Continue breast self-awareness checks.  Avoid chewing and smoking tobacco.  Avoid alcohol and drug use. Some medicines that may be harmful to your baby can pass through breast milk. It is important to ask your health care provider before taking any medicine, including all over-the-counter and prescription medicine as well as vitamin and herbal supplements. It is possible to become pregnant while breastfeeding. If birth control is desired, ask your health care provider about options that will be safe for your baby. SEEK MEDICAL CARE IF:   You feel like you want to stop breastfeeding or have become frustrated with breastfeeding.  You have painful breasts or nipples.  Your nipples are cracked or bleeding.  Your breasts are red, tender, or warm.  You have a swollen area on either breast.  You have a fever or chills.  You have nausea or  vomiting.  You have drainage other than breast milk from your nipples.  Your breasts do not become full before feedings by the fifth day after you give birth.  You feel sad and depressed.  Your baby is too sleepy to eat well.  Your baby is having trouble sleeping.   Your baby is wetting less than 3 diapers in a 24-hour period.  Your baby has less than 3 stools in a 24-hour period.  Your baby's skin or the white part of his or her eyes becomes yellow.   Your baby is not gaining weight by 20 days of age. SEEK IMMEDIATE MEDICAL CARE IF:   Your baby is overly tired (lethargic) and does not want to wake up and feed.  Your baby develops an unexplained fever. Document Released: 04/03/2005 Document Revised: 04/08/2013 Document Reviewed: 09/25/2012 [facility] Patient Information 2015 [place], [place]. This information is not intended to replace advice given to you by your health care provider. Make sure you discuss any questions you have with your health care provider.

## 2014-03-03 NOTE — Plan of Care (Signed)
Problem: Discharge Progression Outcomes Goal: Tolerating diet Outcome: Completed/Met Date Met:  03/03/14 Goal: Pain controlled with appropriate interventions Outcome: Completed/Met Date Met:  03/03/14 Goal: MMR given as ordered Outcome: Not Applicable Date Met:  97/35/32

## 2014-03-04 ENCOUNTER — Ambulatory Visit: Payer: Self-pay

## 2014-03-04 DIAGNOSIS — Z98891 History of uterine scar from previous surgery: Secondary | ICD-10-CM

## 2014-03-04 NOTE — Plan of Care (Signed)
Problem: Discharge Progression Outcomes Goal: Barriers To Progression Addressed/Resolved Outcome: Not Applicable Date Met:  72/09/47 Goal: Activity appropriate for discharge plan Outcome: Completed/Met Date Met:  09/62/83 Goal: Complications resolved/controlled Outcome: Not Applicable Date Met:  66/29/47 Goal: Afebrile, VS remain stable at discharge Outcome: Completed/Met Date Met:  03/04/14 Goal: Discharge plan in place and appropriate Outcome: Completed/Met Date Met:  03/04/14 Goal: Other Discharge Outcomes/Goals Outcome: Not Applicable Date Met:  65/46/50

## 2014-03-04 NOTE — Lactation Note (Signed)
This note was copied from the chart of Lindsey Beard. Lactation Consultation Note  Patient Name: Lindsey Beard [id number] Date: 03/04/2014 Reason for consult: Follow-up assessment (engorgement imporved per mom after icing and pumping , see LC note )  [place] visit mom in room at 1145 , noted to be engorged bilaterally eventhough she had pumped with a hand pump . 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 LC recommended after icing to pump with the DEBP both breast for 15 -20 mins.  LC checked on mom at 1400 and per mom was feeling much better after ice and pumping , with 2 oz EBM yield.Per  Mom will be picking up DEBP [facility] loaner tomorrow at the [facility] .  Also will be seeing Albesa Seen IBCLC at [facility].    Maternal Data    Feeding Feeding Type:  (baby recently received 30 ml of EBM in a bottle per mom )  LATCH Score/Interventions                Intervention(s): Breastfeeding basics reviewed     Lactation Tools Discussed/Used WIC Program: Yes ([facility] , FAXED form for a DEBP ) Pump Review: Setup, frequency, and cleaning;Milk Storage Initiated by:: MAI    Consult Status Consult Status: Complete (plans to F/U with Albesa Seen tomorrow in [place] point , IBCLC ) Date: 03/04/14 Follow-up type: In-patient    Myer Haff 03/04/2014, 3:41 PM

## 2014-03-04 NOTE — Discharge Instructions (Signed)

## 2014-03-04 NOTE — Plan of Care (Signed)
Problem: Discharge Progression Outcomes Goal: Remove staples per MD order Outcome: Not Applicable Date Met:  24/29/98

## 2014-03-10 ENCOUNTER — Encounter (HOSPITAL_BASED_OUTPATIENT_CLINIC_OR_DEPARTMENT_OTHER): Payer: Self-pay | Admitting: *Deleted

## 2014-03-10 ENCOUNTER — Emergency Department (HOSPITAL_BASED_OUTPATIENT_CLINIC_OR_DEPARTMENT_OTHER)
Admission: EM | Admit: 2014-03-10 | Discharge: 2014-03-11 | Disposition: A | Discharge status: Home / Self Care | Payer: Medicaid Other | Attending: Emergency Medicine | Admitting: Emergency Medicine

## 2014-03-10 DIAGNOSIS — O860 Infection of obstetric surgical wound: Secondary | ICD-10-CM | POA: Diagnosis present

## 2014-03-10 DIAGNOSIS — B9789 Other viral agents as the cause of diseases classified elsewhere: Secondary | ICD-10-CM | POA: Insufficient documentation

## 2014-03-10 DIAGNOSIS — Z8619 Personal history of other infectious and parasitic diseases: Secondary | ICD-10-CM | POA: Diagnosis not present

## 2014-03-10 DIAGNOSIS — Z98891 History of uterine scar from previous surgery: Secondary | ICD-10-CM

## 2014-03-10 DIAGNOSIS — Z79899 Other long term (current) drug therapy: Secondary | ICD-10-CM | POA: Insufficient documentation

## 2014-03-10 DIAGNOSIS — T798XXA Other early complications of trauma, initial encounter: Secondary | ICD-10-CM

## 2014-03-10 NOTE — ED Notes (Signed)
c section 10 days ago. Drainage from her incision site.

## 2014-03-10 NOTE — ED Provider Notes (Signed)
CSN: [id number]     Arrival date & time 03/10/14  2036 History  This chart was scribed for Veryl Speak, MD by Rayfield Citizen, ED Scribe. This patient was seen in room MH08/MH08 and the patient's care was started at 11:55 PM.    Chief Complaint  Patient presents with  . Wound Check   Patient is a 23 y.o. female presenting with wound check. The history is provided by the patient. No language interpreter was used.  Wound Check This is a new problem. The current episode started more than 1 week ago. The problem has been gradually worsening. Pertinent negatives include no abdominal pain. The symptoms are aggravated by bending and twisting. Nothing relieves the symptoms. She has tried nothing for the symptoms.     HPI Comments: Lindsey Beard is a 23 y.o. female who presents to the Emergency Department complaining of pain to the closure of her recent caesarian section (03/01/14). She reports bloody drainage and pain with movement, acute pain on the left side of the incision. Her baby was delivered at [facility]; she has a follow up scheduled with an OB on 12/4.   Past Medical History  Diagnosis Date  . Medical history non-contributory   . Pregnant   . Hx of chlamydia infection   . Hx of varicella    Past Surgical History  Procedure Laterality Date  . No past surgeries    . Dilitation and curretage    . Cesarean section N/A 03/01/2014    Procedure: CESAREAN SECTION;  Surgeon: Maeola Sarah. Landry Mellow, MD;  Location: [facility];  Service: Obstetrics;  Laterality: N/A;   Family History  Problem Relation Age of Onset  . Cancer Maternal Aunt    History  Substance Use Topics  . Smoking status: Never Smoker   . Smokeless tobacco: Not on file  . Alcohol Use: No   OB History    Gravida Para Term Preterm AB TAB SAB Ectopic Multiple Living   2 1 1  1  1   0 1     Review of Systems  Gastrointestinal: Negative for abdominal pain.    A complete 10 system review of systems was obtained and all systems are  negative except as noted in the HPI and PMH.   Allergies  Review of patient's allergies indicates no known allergies.  Home Medications   Prior to Admission medications   Medication Sig Start Date End Date Taking? Authorizing Provider  ferrous sulfate 325 (65 FE) MG tablet Take 1 tablet (325 mg total) by mouth 2 (two) times daily with a meal. Patient not taking: Reported on 02/20/2014 11/25/13   Venus Standard, CNM  ibuprofen (ADVIL,MOTRIN) 600 MG tablet Take 1 tablet (600 mg total) by mouth every 6 (six) hours. 03/03/14   Venus Standard, CNM  oxyCODONE-acetaminophen (PERCOCET/ROXICET) 5-325 MG per tablet Take 1 tablet by mouth every 4 (four) hours as needed (for pain scale less than 7). 03/03/14   Venus Standard, CNM  Prenatal Vit-Fe Fumarate-FA (PRENATAL MULTIVITAMIN) TABS tablet Take 1 tablet by mouth daily.     Historical Provider, MD   BP 144/74 mmHg  Pulse 60  Temp(Src) 99 F (37.2 C) (Oral)  Resp 20  Wt 180 lb (81.647 kg)  SpO2 100%  LMP 05/18/2013 Physical Exam  Constitutional: She is oriented to person, place, and time. She appears well-developed and well-nourished.  HENT:  Head: Normocephalic and atraumatic.  Neck: No tracheal deviation present.  Cardiovascular: Normal rate.   Pulmonary/Chest: Effort normal. No respiratory  distress.  Abdominal: Soft. She exhibits no distension. There is no tenderness.  Neurological: She is alert and oriented to person, place, and time.  Skin: Skin is warm and dry.  There is a horizontal c-section scar present in the suprapubic area. The left edge of the incision appears to be separating with mild to moderate purulent drainage. There is mild inflammation and erythema of the surrounding skin.   Psychiatric: She has a normal mood and affect. Her behavior is normal.  Nursing note and vitals reviewed.   ED Course  Procedures   DIAGNOSTIC STUDIES: Oxygen Saturation is 100% on RA, normal by my interpretation.    COORDINATION OF  CARE: 12:01 AM Discussed treatment plan with pt at bedside and pt agreed to plan.   Labs Review Labs Reviewed - No data to display  Imaging Review No results found.   EKG Interpretation None      MDM   Final diagnoses:  None    There appears to be a slight separation of the wound edges at the left and of the surgical incision along with a slight purulent drainage. My concerns are for a wound infection. This will be treated with ceftriaxone IM followed by by mouth Keflex. I've advised her to call her surgeon tomorrow to discuss a follow-up appointment. She understands to return [facility] or [facility] if she develops high fever or worsening of her symptoms.   I personally performed the services described in this documentation, which was scribed in my presence. The recorded information has been reviewed and is accurate.       Veryl Speak, MD 03/11/14 [phone]

## 2014-03-10 NOTE — ED Notes (Signed)
MD at bedside. 

## 2014-03-10 NOTE — ED Notes (Signed)
Pt states that when she was discharged from hospital s/p delivery she had some drainage at that time, per patient she was told to dry area with hair dryer, wound bed is moist and painful to touch

## 2014-03-11 MED ORDER — CEFTRIAXONE SODIUM 1 G IJ SOLR
1.0000 g | Freq: Once | INTRAMUSCULAR | Status: AC
  Administered 2014-03-11: 1 g via INTRAMUSCULAR
  Filled 2014-03-11: qty 10

## 2014-03-11 MED ORDER — CEPHALEXIN 500 MG PO CAPS: 500.0000 mg | ORAL_CAPSULE | Freq: Four times a day (QID) | ORAL | Status: DC

## 2014-03-11 NOTE — Discharge Instructions (Signed)
Keflex as prescribed.  Follow-up with your OB/GYN tomorrow. I recommend you call in the morning to arrange this appointment.  Return to the emergency department if you develop high fever, vomiting, worsening pain, or other new and concerning symptoms.   Wound Infection A wound infection happens when a type of germ (bacteria) starts growing in the wound. In some cases, this can cause the wound to break open. If cared for properly, the infected wound will heal from the inside to the outside. Wound infections need treatment. CAUSES An infection is caused by bacteria growing in the wound.  SYMPTOMS   Increase in redness, swelling, or pain at the wound site.  Increase in drainage at the wound site.  Wound or bandage (dressing) starts to smell bad.  Fever.  Feeling tired or fatigued.  Pus draining from the wound. TREATMENT  Your health care provider will prescribe antibiotic medicine. The wound infection should improve within 24 to 48 hours. Any redness around the wound should stop spreading and the wound should be less painful.  HOME CARE INSTRUCTIONS   Only take over-the-counter or prescription medicines for pain, discomfort, or fever as directed by your health care provider.  Take your antibiotics as directed. Finish them even if you start to feel better.  Gently wash the area with mild soap and water 2 times a day, or as directed. Rinse off the soap. Pat the area dry with a clean towel. Do not rub the wound. This may cause bleeding.  Follow your health care provider's instructions for how often you need to change the dressing.  Apply ointment and a dressing to the wound as directed.  If the dressing sticks, moisten it with soapy water and gently remove it.  Change the bandage right away if it becomes wet, dirty, or develops a bad smell.  Take showers. Do not take tub baths, swim, or do anything that may soak the wound until it is healed.  Avoid exercises that make you sweat  heavily.  Use anti-itch medicine as directed by your health care provider. The wound may itch when it is healing. Do not pick or scratch at the wound.  Follow up with your health care provider to get your wound rechecked as directed. SEEK MEDICAL CARE IF:  You have an increase in swelling, pain, or redness around the wound.  You have an increase in the amount of pus coming from the wound.  There is a bad smell coming from the wound.  More of the wound breaks open.  You have a fever. MAKE SURE YOU:   Understand these instructions.  Will watch your condition.  Will get help right away if you are not doing well or get worse. Document Released: 12/31/2002 Document Revised: 04/08/2013 Document Reviewed: 08/07/2010 [facility] Patient Information 2015 [place], [place]. This information is not intended to replace advice given to you by your health care provider. Make sure you discuss any questions you have with your health care provider.

## 2014-07-31 ENCOUNTER — Encounter (HOSPITAL_BASED_OUTPATIENT_CLINIC_OR_DEPARTMENT_OTHER): Payer: Self-pay

## 2014-07-31 ENCOUNTER — Emergency Department (HOSPITAL_BASED_OUTPATIENT_CLINIC_OR_DEPARTMENT_OTHER)
Admission: EM | Admit: 2014-07-31 | Discharge: 2014-07-31 | Disposition: A | Discharge status: Home / Self Care | Payer: Medicaid Other | Attending: Emergency Medicine | Admitting: Emergency Medicine

## 2014-07-31 DIAGNOSIS — Z8619 Personal history of other infectious and parasitic diseases: Secondary | ICD-10-CM | POA: Insufficient documentation

## 2014-07-31 DIAGNOSIS — R197 Diarrhea, unspecified: Secondary | ICD-10-CM | POA: Diagnosis not present

## 2014-07-31 DIAGNOSIS — Z792 Long term (current) use of antibiotics: Secondary | ICD-10-CM | POA: Diagnosis not present

## 2014-07-31 DIAGNOSIS — R1084 Generalized abdominal pain: Secondary | ICD-10-CM | POA: Diagnosis present

## 2014-07-31 DIAGNOSIS — Z3202 Encounter for pregnancy test, result negative: Secondary | ICD-10-CM | POA: Diagnosis not present

## 2014-07-31 DIAGNOSIS — Z79899 Other long term (current) drug therapy: Secondary | ICD-10-CM | POA: Insufficient documentation

## 2014-07-31 LAB — URINALYSIS, ROUTINE W REFLEX MICROSCOPIC
Bilirubin Urine: NEGATIVE
Glucose, UA: NEGATIVE mg/dL
Hgb urine dipstick: NEGATIVE
Ketones, ur: NEGATIVE mg/dL
LEUKOCYTES UA: NEGATIVE
NITRITE: NEGATIVE
PROTEIN: NEGATIVE mg/dL
Specific Gravity, Urine: 1.028 (ref 1.005–1.030)
Urobilinogen, UA: 1 mg/dL (ref 0.0–1.0)
pH: 6 (ref 5.0–8.0)

## 2014-07-31 LAB — BASIC METABOLIC PANEL
Anion gap: 8 (ref 5–15)
BUN: 11 mg/dL (ref 6–23)
CO2: 27 mmol/L (ref 19–32)
CREATININE: 0.67 mg/dL (ref 0.50–1.10)
Calcium: 9.1 mg/dL (ref 8.4–10.5)
Chloride: 103 mmol/L (ref 96–112)
GFR calc Af Amer: >90 mL/min (ref >90)
GFR calc non Af Amer: >90 mL/min (ref >90)
Glucose, Bld: 97 mg/dL (ref 70–99)
POTASSIUM: 3.4 mmol/L — AB (ref 3.5–5.1)
SODIUM: 138 mmol/L (ref 135–145)

## 2014-07-31 LAB — CBC WITH DIFFERENTIAL/PLATELET
BASOS ABS: 0 10*3/uL (ref 0.0–0.1)
Basophils Relative: 0 % (ref 0–1)
Eosinophils Absolute: 0.2 10*3/uL (ref 0.0–0.7)
Eosinophils Relative: 3 % (ref 0–5)
HCT: 36.6 % (ref 36.0–46.0)
Hemoglobin: 12.1 g/dL (ref 12.0–15.0)
LYMPHS ABS: 2.7 10*3/uL (ref 0.7–4.0)
LYMPHS PCT: 37 % (ref 12–46)
MCH: 28.6 pg (ref 26.0–34.0)
MCHC: 33.1 g/dL (ref 30.0–36.0)
MCV: 86.5 fL (ref 78.0–100.0)
MONO ABS: 0.5 10*3/uL (ref 0.1–1.0)
Monocytes Relative: 7 % (ref 3–12)
NEUTROS ABS: 3.9 10*3/uL (ref 1.7–7.7)
Neutrophils Relative %: 53 % (ref 43–77)
Platelets: 395 10*3/uL (ref 150–400)
RBC: 4.23 MIL/uL (ref 3.87–5.11)
RDW: 14.3 % (ref 11.5–15.5)
WBC: 7.3 10*3/uL (ref 4.0–10.5)

## 2014-07-31 LAB — PREGNANCY, URINE: Preg Test, Ur: NEGATIVE

## 2014-07-31 MED ORDER — LOPERAMIDE HCL 2 MG PO CAPS: 2.0000 mg | ORAL_CAPSULE | Freq: Four times a day (QID) | ORAL | Status: DC | PRN

## 2014-07-31 MED ORDER — SODIUM CHLORIDE 0.9 % IV BOLUS (SEPSIS)
1000.0000 mL | Freq: Once | INTRAVENOUS | Status: AC
  Administered 2014-07-31: 1000 mL via INTRAVENOUS

## 2014-07-31 NOTE — ED Provider Notes (Signed)
CSN: [id number]     Arrival date & time 07/31/14  1722 History   First MD Initiated Contact with Patient 07/31/14 1803     Chief Complaint  Patient presents with  . Abdominal Pain     (Consider location/radiation/quality/duration/timing/severity/associated sxs/prior Treatment) HPI Comments: Patient is a 24 year old female who presents with a 1 month history of abdominal pain. The pain is located in her generalized abdomen and does not radiate. The pain is described as cramping and severe. The pain started gradually and progressively worsened since the onset. No alleviating/aggravating factors. The patient has tried nothing for symptoms without relief. Associated symptoms include watery diarrhea. She reports 5 episodes of diarrhea per day. Patient denies fever, headache, nausea, vomiting, chest pain, SOB, dysuria, constipation, abnormal vaginal bleeding/discharge.      Past Medical History  Diagnosis Date  . Medical history non-contributory   . Pregnant   . Hx of chlamydia infection   . Hx of varicella    Past Surgical History  Procedure Laterality Date  . No past surgeries    . Dilitation and curretage    . Cesarean section N/A 03/01/2014    Procedure: CESAREAN SECTION;  Surgeon: Maeola Sarah. Landry Mellow, MD;  Location: [facility];  Service: Obstetrics;  Laterality: N/A;   Family History  Problem Relation Age of Onset  . Cancer Maternal Aunt    History  Substance Use Topics  . Smoking status: Never Smoker   . Smokeless tobacco: Not on file  . Alcohol Use: No   OB History    Gravida Para Term Preterm AB TAB SAB Ectopic Multiple Living   2 1 1  1  1   0 1     Review of Systems  Constitutional: Negative for fever, chills and fatigue.  HENT: Negative for trouble swallowing.   Eyes: Negative for visual disturbance.  Respiratory: Negative for shortness of breath.   Cardiovascular: Negative for chest pain and palpitations.  Gastrointestinal: Positive for abdominal pain and diarrhea.  Negative for nausea and vomiting.  Genitourinary: Negative for dysuria and difficulty urinating.  Musculoskeletal: Negative for arthralgias and neck pain.  Skin: Negative for color change.  Neurological: Negative for dizziness and weakness.  Psychiatric/Behavioral: Negative for dysphoric mood.      Allergies  Review of patient's allergies indicates no known allergies.  Home Medications   Prior to Admission medications   Medication Sig Start Date End Date Taking? Authorizing Provider  cephALEXin (KEFLEX) 500 MG capsule Take 1 capsule (500 mg total) by mouth 4 (four) times daily. 03/11/14   Veryl Speak, MD  ferrous sulfate 325 (65 FE) MG tablet Take 1 tablet (325 mg total) by mouth 2 (two) times daily with a meal. Patient not taking: Reported on 02/20/2014 11/25/13   Venus Standard, CNM  ibuprofen (ADVIL,MOTRIN) 600 MG tablet Take 1 tablet (600 mg total) by mouth every 6 (six) hours. 03/03/14   Venus Standard, CNM  oxyCODONE-acetaminophen (PERCOCET/ROXICET) 5-325 MG per tablet Take 1 tablet by mouth every 4 (four) hours as needed (for pain scale less than 7). 03/03/14   Venus Standard, CNM  Prenatal Vit-Fe Fumarate-FA (PRENATAL MULTIVITAMIN) TABS tablet Take 1 tablet by mouth daily.     Historical Provider, MD   BP 126/71 mmHg  Pulse 91  Temp(Src) 98.4 F (36.9 C) (Oral)  Resp 16  Ht 5' (1.524 m)  Wt 170 lb (77.111 kg)  BMI 33.20 kg/m2  SpO2 97%  LMP 06/26/2014 Physical Exam  Constitutional: She is oriented to person,  place, and time. She appears well-developed and well-nourished. No distress.  HENT:  Head: Normocephalic and atraumatic.  Eyes: Conjunctivae and EOM are normal.  Neck: Normal range of motion.  Cardiovascular: Normal rate and regular rhythm.  Exam reveals no gallop and no friction rub.   No murmur heard. Pulmonary/Chest: Effort normal and breath sounds normal. She has no wheezes. She has no rales. She exhibits no tenderness.  Abdominal: Soft. She exhibits no  distension. There is no tenderness. There is no rebound.  Musculoskeletal: Normal range of motion.  Neurological: She is alert and oriented to person, place, and time. Coordination normal.  Speech is goal-oriented. Moves limbs without ataxia.   Skin: Skin is warm and dry.  Psychiatric: She has a normal mood and affect. Her behavior is normal.  Nursing note and vitals reviewed.   ED Course  Procedures (including critical care time) Labs Review Labs Reviewed  BASIC METABOLIC PANEL - Abnormal; Notable for the following:    Potassium 3.4 (*)    All other components within normal limits  PREGNANCY, URINE  URINALYSIS, ROUTINE W REFLEX MICROSCOPIC  CBC WITH DIFFERENTIAL/PLATELET    Imaging Review No results found.   EKG Interpretation None      MDM   Final diagnoses:  Diarrhea    6:30 PM Labs pending. Vitals stable and patient afebrile.   8:06 PM Labs unremarkable. Patient will have imodium for diarrhea.   Alvina Chou, PA-C 07/31/14 2007  Merryl Hacker, MD 08/01/14 [phone]

## 2014-07-31 NOTE — ED Notes (Signed)
Reports abdominal pain x 1 month. Some diarrhea. Denies n/v, urinary symptoms and discharge

## 2014-07-31 NOTE — Discharge Instructions (Signed)
Take imodium as needed for diarrhea. Follow up with your doctor for further evaluation.

## 2015-12-29 ENCOUNTER — Encounter: Payer: Self-pay | Admitting: Family Medicine

## 2015-12-29 ENCOUNTER — Ambulatory Visit (INDEPENDENT_AMBULATORY_CARE_PROVIDER_SITE_OTHER): Payer: Medicaid Other | Admitting: Family Medicine

## 2015-12-29 VITALS — BP 98/62 | HR 60 | Temp 98.6°F | Resp 16 | Ht 59.0 in | Wt 163.0 lb

## 2015-12-29 DIAGNOSIS — E669 Obesity, unspecified: Secondary | ICD-10-CM

## 2015-12-29 DIAGNOSIS — Z0000 Encounter for general adult medical examination without abnormal findings: Secondary | ICD-10-CM

## 2015-12-29 DIAGNOSIS — Z124 Encounter for screening for malignant neoplasm of cervix: Secondary | ICD-10-CM

## 2015-12-29 DIAGNOSIS — Z113 Encounter for screening for infections with a predominantly sexual mode of transmission: Secondary | ICD-10-CM | POA: Diagnosis not present

## 2015-12-29 LAB — CBC WITH DIFFERENTIAL/PLATELET
Basophils Absolute: 0 cells/uL (ref 0–200)
Basophils Relative: 0 %
Eosinophils Absolute: 168 cells/uL (ref 15–500)
Eosinophils Relative: 3 %
HCT: 36.9 % (ref 35.0–45.0)
Hemoglobin: 12.1 g/dL (ref 12.0–15.0)
Lymphocytes Relative: 37 %
Lymphs Abs: 2072 cells/uL (ref 850–3900)
MCH: 28.5 pg (ref 27.0–33.0)
MCHC: 32.8 g/dL (ref 32.0–36.0)
MCV: 86.8 fL (ref 80.0–100.0)
MONO ABS: 560 {cells}/uL (ref 200–950)
MPV: 9.9 fL (ref 7.5–12.5)
Monocytes Relative: 10 %
NEUTROS ABS: 2800 {cells}/uL (ref 1500–7800)
Neutrophils Relative %: 50 %
Platelets: 358 10*3/uL (ref 140–400)
RBC: 4.25 MIL/uL (ref 3.80–5.10)
RDW: 12.7 % (ref 11.0–15.0)
WBC: 5.6 10*3/uL (ref 3.8–10.8)

## 2015-12-29 LAB — COMPREHENSIVE METABOLIC PANEL
ALT: 40 U/L — AB (ref 6–29)
AST: 39 U/L — ABNORMAL HIGH (ref 10–30)
Albumin: 3.9 g/dL (ref 3.6–5.1)
Alkaline Phosphatase: 57 U/L (ref 33–115)
BUN: 9 mg/dL (ref 7–25)
CALCIUM: 9.1 mg/dL (ref 8.6–10.2)
CHLORIDE: 108 mmol/L (ref 98–110)
CO2: 24 mmol/L (ref 20–31)
Creat: 0.64 mg/dL (ref 0.50–1.10)
Glucose, Bld: 89 mg/dL (ref 70–99)
POTASSIUM: 4 mmol/L (ref 3.5–5.3)
Sodium: 140 mmol/L (ref 135–146)
TOTAL PROTEIN: 7.1 g/dL (ref 6.1–8.1)
Total Bilirubin: 0.3 mg/dL (ref 0.2–1.2)

## 2015-12-29 LAB — WET PREP FOR TRICH, YEAST, CLUE
Trich, Wet Prep: NONE SEEN
YEAST WET PREP: NONE SEEN

## 2015-12-29 LAB — LIPID PANEL
CHOL/HDL RATIO: 4.1 ratio (ref <=5.0)
CHOLESTEROL: 157 mg/dL (ref 125–200)
HDL: 38 mg/dL — AB (ref >=46)
LDL Cholesterol: 103 mg/dL (ref <130)
Triglycerides: 80 mg/dL (ref <150)
VLDL: 16 mg/dL (ref <30)

## 2015-12-29 LAB — TSH: TSH: 1.51 mIU/L

## 2015-12-29 NOTE — Progress Notes (Signed)
   Subjective:    Patient ID: Lindsey Beard, female    DOB: [birth year], 25 y.o.   MRN: [id number]  Patient presents for New Patient (Initial Visit)   No current PCP, last seen by GYN   History of anemia- no current treatment   Has 72 year old daughter, no current relationship  Family history of diabetes mellitus- Mother  She has been trying to lose weight, exercised for 1 month, then gave up as she saw no changes in abdominal fat.  Typically runs low normotenstive BP   LMP 11/30/15  Review Of Systems:  GEN- denies fatigue, fever, weight loss,weakness, recent illness HEENT- denies eye drainage, change in vision, nasal discharge, CVS- denies chest pain, palpitations RESP- denies SOB, cough, wheeze ABD- denies N/V, change in stools, abd pain GU- denies dysuria, hematuria, dribbling, incontinence MSK- denies joint pain, muscle aches, injury Neuro- denies headache, dizziness, syncope, seizure activity       Objective:    BP 98/62   Pulse 60   Temp 98.6 F (37 C) (Oral)   Resp 16   Ht 4\' 11"  (1.499 m)   Wt 163 lb (73.9 kg)   BMI 32.92 kg/m  GEN- NAD, alert and oriented x3,obese  HEENT- PERRL, EOMI, non injected sclera, pink conjunctiva, MMM, oropharynx clear Neck- Supple, no thyromegaly CVS- RRR, no murmur RESP-CTAB Breast- normal symmetry, no nipple inversion,no nipple drainage, no nodules or lumps felt Nodes- no axillary nodes ABD-NABS,soft,NT,ND GU- normal external genitalia, vaginal mucosa pink and moist, cervix visualized no growth, no blood form os, minimal thin clear discharge, no CMT, no ovarian masses, uterus normal size Skin- in tact, tatoos right shoulder, neck  EXT- No edema Pulses- Radial, DP- 2+        Assessment & Plan:      Problem List Items Addressed This Visit    Routine general medical examination at a health care facility    CPE done, discussed dietary changes, lower carbs, avoid fast food, more protein, return to exercise, declines flu  shot, STD screening, PAP Smear done For her height needs to lose at least 30lbs       Relevant Orders   CBC with Differential/Platelet   Comprehensive metabolic panel   Lipid panel   TSH   Obesity    Other Visit Diagnoses    Cervical cancer screening    -  Primary   Relevant Orders   PAP, ThinPrep ASCUS Rflx HPV Rflx Type   Screen for STD (sexually transmitted disease)       Relevant Orders   WET PREP FOR TRICH, YEAST, CLUE   HIV antibody   GC/Chlamydia Probe Amp      Note: This dictation was prepared with Dragon dictation along with smaller Company secretary. Any transcriptional errors that result from this process are unintentional.

## 2015-12-29 NOTE — Assessment & Plan Note (Signed)
CPE done, discussed dietary changes, lower carbs, avoid fast food, more protein, return to exercise, declines flu shot, STD screening, PAP Smear done For her height needs to lose at least 30lbs

## 2015-12-29 NOTE — Patient Instructions (Signed)
I recommend eye visit once a year I recommend dental visit every 6 months Goal is to  Exercise 30 minutes 5 days a week We will send a letter with lab results  F/u PENDING RESULTS Give new patient paperwork for her daughter- also have her sign release for daughter- [facility]

## 2015-12-30 LAB — GC/CHLAMYDIA PROBE AMP
CT Probe RNA: NOT DETECTED
GC Probe RNA: NOT DETECTED

## 2015-12-30 LAB — HIV ANTIBODY (ROUTINE TESTING W REFLEX): HIV: NONREACTIVE

## 2015-12-31 LAB — PAP THINPREP ASCUS RFLX HPV RFLX TYPE

## 2016-01-06 ENCOUNTER — Other Ambulatory Visit: Payer: Self-pay | Admitting: *Deleted

## 2016-01-06 DIAGNOSIS — R7989 Other specified abnormal findings of blood chemistry: Secondary | ICD-10-CM

## 2016-01-06 DIAGNOSIS — R945 Abnormal results of liver function studies: Principal | ICD-10-CM

## 2016-01-06 MED ORDER — METRONIDAZOLE 500 MG PO TABS: 500.0000 mg | ORAL_TABLET | Freq: Two times a day (BID) | ORAL | 0 refills | Status: DC

## 2016-02-07 ENCOUNTER — Ambulatory Visit: Payer: Self-pay

## 2016-03-20 ENCOUNTER — Encounter (HOSPITAL_COMMUNITY): Payer: Self-pay | Admitting: Emergency Medicine

## 2016-03-20 ENCOUNTER — Emergency Department (HOSPITAL_COMMUNITY)
Admission: EM | Admit: 2016-03-20 | Discharge: 2016-03-20 | Disposition: A | Discharge status: Home / Self Care | Payer: Medicaid Other | Attending: Emergency Medicine | Admitting: Emergency Medicine

## 2016-03-20 ENCOUNTER — Emergency Department (HOSPITAL_COMMUNITY): Payer: Medicaid Other

## 2016-03-20 DIAGNOSIS — J069 Acute upper respiratory infection, unspecified: Secondary | ICD-10-CM | POA: Diagnosis not present

## 2016-03-20 DIAGNOSIS — B9789 Other viral agents as the cause of diseases classified elsewhere: Secondary | ICD-10-CM

## 2016-03-20 DIAGNOSIS — R05 Cough: Secondary | ICD-10-CM | POA: Diagnosis present

## 2016-03-20 MED ORDER — IBUPROFEN 600 MG PO TABS: 600.0000 mg | ORAL_TABLET | Freq: Four times a day (QID) | ORAL | 0 refills | Status: DC | PRN

## 2016-03-20 MED ORDER — BENZONATATE 100 MG PO CAPS: 200.0000 mg | ORAL_CAPSULE | Freq: Three times a day (TID) | ORAL | 0 refills | Status: DC

## 2016-03-20 MED ORDER — HYDROCOD POLST-CPM POLST ER 10-8 MG/5ML PO SUER
5.0000 mL | Freq: Once | ORAL | Status: AC
  Administered 2016-03-20: 5 mL via ORAL
  Filled 2016-03-20: qty 5

## 2016-03-20 MED ORDER — LORATADINE-PSEUDOEPHEDRINE ER 5-120 MG PO TB12: 1.0000 | ORAL_TABLET | Freq: Two times a day (BID) | ORAL | 0 refills | Status: DC

## 2016-03-20 NOTE — Discharge Instructions (Signed)
Please use Claritin-D every 12 hours. Please use 600 mg of ibuprofen every 6 hours with food. Please increase water, juices, Gatorade, etc. Please wash hands frequently. Please use a mask until symptoms have resolved. Please see Dr. Buelah Manis or return to the emergency department if symptoms are worsening.

## 2016-03-20 NOTE — ED Provider Notes (Signed)
[facility] DEPT Provider Note   CSN: [id number] Arrival date & time: 03/20/16  2058     History   Chief Complaint Chief Complaint  Patient presents with  . Cough    HPI ENESSA CHARITY is a 25 y.o. female.  Patient is a 25 year old female who presents to the emergency department with a complaint of cough.  The patient gives a 2 day history of body aches, cough, congestion. Patient states she feels as though she's had a fever, but has not measured a temperature elevation at home. There's been no vomiting, no diarrhea reported. No unusual rash noted. The patient states she has been around others who've been ill. She presents now for assistance with this issue.      Past Medical History:  Diagnosis Date  . Hx of chlamydia infection   . Hx of varicella   . Medical history non-contributory   . Pregnant     Patient Active Problem List   Diagnosis Date Noted  . Obesity 12/29/2015  . Routine general medical examination at a health care facility 12/29/2015  . Short stature 02/16/2014    Past Surgical History:  Procedure Laterality Date  . CESAREAN SECTION N/A 03/01/2014   Procedure: CESAREAN SECTION;  Surgeon: Maeola Sarah. Landry Mellow, MD;  Location: [facility];  Service: Obstetrics;  Laterality: N/A;  . dilitation and curretage    . NO PAST SURGERIES      OB History    Gravida Para Term Preterm AB Living   2 1 1   1 1    SAB TAB Ectopic Multiple Live Births   1     0 1       Home Medications    Prior to Admission medications   Medication Sig Start Date End Date Taking? Authorizing Provider  metroNIDAZOLE (FLAGYL) 500 MG tablet Take 1 tablet (500 mg total) by mouth 2 (two) times daily. 01/06/16   Alycia Rossetti, MD    Family History Family History  Problem Relation Age of Onset  . Cancer Maternal Aunt   . Diabetes Mother     Social History Social History  Substance Use Topics  . Smoking status: Never Smoker  . Smokeless tobacco: Never Used  . Alcohol use No      Allergies   Patient has no known allergies.   Review of Systems Review of Systems  Constitutional: Positive for chills.  HENT: Positive for congestion.   Respiratory: Positive for cough.   Musculoskeletal: Positive for myalgias.  All other systems reviewed and are negative.    Physical Exam Updated Vital Signs LMP 03/16/2016   Physical Exam  Constitutional: She is oriented to person, place, and time. She appears well-developed and well-nourished.  Non-toxic appearance.  HENT:  Head: Normocephalic.  Right Ear: Tympanic membrane and external ear normal.  Left Ear: Tympanic membrane and external ear normal.  Nasal congestion present.  Eyes: EOM and lids are normal. Pupils are equal, round, and reactive to light.  Neck: Normal range of motion. Neck supple. Carotid bruit is not present.  Cardiovascular: Normal rate, regular rhythm, normal heart sounds, intact distal pulses and normal pulses.   Pulmonary/Chest: Breath sounds normal. No respiratory distress.  Abdominal: Soft. Bowel sounds are normal. There is no tenderness. There is no guarding.  Musculoskeletal: Normal range of motion.  Lymphadenopathy:       Head (right side): No submandibular adenopathy present.       Head (left side): No submandibular adenopathy present.    She  has no cervical adenopathy.  Neurological: She is alert and oriented to person, place, and time. She has normal strength. No cranial nerve deficit or sensory deficit.  Skin: Skin is warm and dry.  Psychiatric: She has a normal mood and affect. Her speech is normal.  Nursing note and vitals reviewed.    ED Treatments / Results  Labs (all labs ordered are listed, but only abnormal results are displayed) Labs Reviewed - No data to display  EKG  EKG Interpretation None       Radiology Dg Chest 2 View  Result Date: 03/20/2016 CLINICAL DATA:  Cough x2 days EXAM: CHEST  2 VIEW COMPARISON:  05/19/2011 FINDINGS: The heart size and  mediastinal contours are within normal limits. Both lungs are clear. The visualized skeletal structures are unremarkable. IMPRESSION: No active cardiopulmonary disease. Electronically Signed   By: Ashley Royalty M.D.   On: 03/20/2016 22:22    Procedures Procedures (including critical care time)  Medications Ordered in ED Medications - No data to display   Initial Impression / Assessment and Plan / ED Course  I have reviewed the triage vital signs and the nursing notes.  Pertinent labs & imaging results that were available during my care of the patient were reviewed by me and considered in my medical decision making (see chart for details).  Clinical Course     *I have reviewed nursing notes, vital signs, and all appropriate lab and imaging results for this patient.**  Final Clinical Impressions(s) / ED Diagnoses  The examination favors an upper respiratory infection. Chest x-ray is negative for acute problem. We discussed the importance of good hydration, good handwashing. We also discussed the use of Claritin-D every 12 hours, and Tylenol every 6 hours for fever or aching. The patient is to follow with primary care physician, or return to the emergency department if any changes, problems, or concerns. Patient is in agreement with this discharge plan.    Final diagnoses:  None    New Prescriptions New Prescriptions   No medications on file     Lily Kocher, PA-C 03/20/16 Lem[place], MD 03/22/16 [phone]

## 2016-03-20 NOTE — ED Triage Notes (Signed)
All over body aches, cough and congestion x 2 days

## 2016-04-05 ENCOUNTER — Encounter: Payer: Self-pay | Admitting: Family Medicine

## 2016-04-05 ENCOUNTER — Ambulatory Visit (INDEPENDENT_AMBULATORY_CARE_PROVIDER_SITE_OTHER): Payer: Medicaid Other | Admitting: Family Medicine

## 2016-04-05 VITALS — BP 100/62 | HR 66 | Temp 98.3°F | Resp 16 | Ht 59.0 in | Wt 157.0 lb

## 2016-04-05 DIAGNOSIS — D2372 Other benign neoplasm of skin of left lower limb, including hip: Secondary | ICD-10-CM | POA: Diagnosis not present

## 2016-04-05 DIAGNOSIS — R12 Heartburn: Secondary | ICD-10-CM

## 2016-04-05 DIAGNOSIS — R7989 Other specified abnormal findings of blood chemistry: Secondary | ICD-10-CM

## 2016-04-05 DIAGNOSIS — R945 Abnormal results of liver function studies: Secondary | ICD-10-CM

## 2016-04-05 LAB — COMPLETE METABOLIC PANEL WITH GFR
ALBUMIN: 3.9 g/dL (ref 3.6–5.1)
ALT: 14 U/L (ref 6–29)
AST: 13 U/L (ref 10–30)
Alkaline Phosphatase: 55 U/L (ref 33–115)
BUN: 11 mg/dL (ref 7–25)
CHLORIDE: 104 mmol/L (ref 98–110)
CO2: 26 mmol/L (ref 20–31)
Calcium: 9.3 mg/dL (ref 8.6–10.2)
Creat: 0.74 mg/dL (ref 0.50–1.10)
GFR, Est African American: >89 mL/min (ref >=60)
GFR, Est Non African American: >89 mL/min (ref >=60)
Glucose, Bld: 79 mg/dL (ref 70–99)
Potassium: 4.2 mmol/L (ref 3.5–5.3)
SODIUM: 139 mmol/L (ref 135–146)
Total Bilirubin: 0.3 mg/dL (ref 0.2–1.2)
Total Protein: 6.6 g/dL (ref 6.1–8.1)

## 2016-04-05 MED ORDER — RANITIDINE HCL 150 MG PO TABS: 150.0000 mg | ORAL_TABLET | Freq: Every day | ORAL | 1 refills | Status: DC

## 2016-04-05 NOTE — Progress Notes (Signed)
   Subjective:    Patient ID: Lindsey Beard, female    DOB: 19-Apr-1990, 25 y.o.   MRN: [id number]  Patient presents for Follow-up (is fasting) and Skin Irritation (x1 year- states that she has sore knot to back of her L upper thigh- denies changes to area or drianage) Pt here for f/u, last visit had CPE to establish care, labs were normal except for BV treated with flagyl and she had mild elevation in LFT, though cholesterol was good, had mild low HDL at 38   No current medications  Has a bump on back of left leg for past year, stings and burns, does not change in size, but not going away   Sharp pains that occur, sometimes in middle chest stomach, other times on left side of breat , denies anxiety/ no change with eating or movement, random times will occur . She does get  Heartburn a lot and belching helps , uses gum no OTC meds           Review Of Systems:  GEN- denies fatigue, fever, weight loss,weakness, recent illness HEENT- denies eye drainage, change in vision, nasal discharge, CVS- denies chest pain, palpitations RESP- denies SOB, cough, wheeze ABD- denies N/V, change in stools,+ abd pain GU- denies dysuria, hematuria, dribbling, incontinence MSK- denies joint pain, muscle aches, injury Neuro- denies headache, dizziness, syncope, seizure activity       Objective:    BP 100/62 (BP Location: Left Arm, Patient Position: Sitting, Cuff Size: Normal)   Pulse 66   Temp 98.3 F (36.8 C) (Oral)   Resp 16   Ht 4\' 11"  (1.499 m)   Wt 157 lb (71.2 kg)   LMP 03/16/2016   SpO2 97%   BMI 31.71 kg/m  GEN- NAD, alert and oriented x3 HEENT- PERRL, EOMI, non injected sclera, pink conjunctiva, MMM, oropharynx clear CVS- RRR, no murmur RESP-CTAB ABD-NABS,soft,mild TTP epigastric region  Skin- hyperpigmented nodule post thigh left, NT no scaliness  EXT- No edema Pulses- Radial  2+        Assessment & Plan:      Problem List Items Addressed This Visit    None     Visit Diagnoses    Heartburn    -  Primary   Trial of zantac for symptoms, doubt cardiac, not anxious appearing    Elevated liver function tests       Recheck LFT    Elevated LFTs       Dermatofibroma of left thigh       benign lesion, nothing needs to be done      Note: This dictation was prepared with Dragon dictation along with smaller phrase technology. Any transcriptional errors that result from this process are unintentional.

## 2016-04-05 NOTE — Patient Instructions (Addendum)
Take the flinestone vitamin  Use protein shake during midday  Take the zantac as prescribed for 2 weeks Avoid the ibuprofen and always take with food when you do F/U 1 year or as needed

## 2016-04-12 ENCOUNTER — Telehealth: Payer: Self-pay

## 2016-04-12 NOTE — Telephone Encounter (Signed)
-----   Message from Alycia Rossetti, MD sent at 04/06/2016  8:54 AM EST ----- Liver labs normal. You can wait until her daughters lead level comes back

## 2016-04-12 NOTE — Telephone Encounter (Signed)
Called patient. Gave lab results. Patient verbalized understanding.  

## 2017-02-27 ENCOUNTER — Telehealth: Payer: Self-pay | Admitting: Family Medicine

## 2017-02-28 ENCOUNTER — Encounter: Payer: Self-pay | Admitting: Family Medicine

## 2017-02-28 ENCOUNTER — Ambulatory Visit (INDEPENDENT_AMBULATORY_CARE_PROVIDER_SITE_OTHER): Payer: Medicaid Other | Admitting: Family Medicine

## 2017-02-28 ENCOUNTER — Other Ambulatory Visit: Payer: Self-pay

## 2017-02-28 VITALS — BP 102/68 | HR 60 | Temp 97.9°F | Resp 16 | Ht 59.0 in | Wt 174.0 lb

## 2017-02-28 DIAGNOSIS — R0789 Other chest pain: Secondary | ICD-10-CM

## 2017-02-28 DIAGNOSIS — K219 Gastro-esophageal reflux disease without esophagitis: Secondary | ICD-10-CM

## 2017-02-28 DIAGNOSIS — R197 Diarrhea, unspecified: Secondary | ICD-10-CM

## 2017-02-28 MED ORDER — DIPHENOXYLATE-ATROPINE 2.5-0.025 MG PO TABS: 1.0000 | ORAL_TABLET | Freq: Four times a day (QID) | ORAL | 0 refills | Status: DC | PRN

## 2017-02-28 NOTE — Patient Instructions (Signed)
Take the probiotics Take the acid medication We will call with lab results Call if not improving after 1 week

## 2017-02-28 NOTE — Progress Notes (Signed)
   Subjective:    Patient ID: Lindsey Beard, female    DOB: 1991/01/28, 26 y.o.   MRN: [id number]  Patient presents for GI Upset (x2 weeks- mid- sternum chest pain that goes through to stomach- loose stools that are watery)   Epigastric and mid sternum ches tpain, for past 2 weeks, with loose stools   No painwiht eating.  No actual emesis.  She does have history of heartburn reflux has not tried anything for this.  She has taken Imodium but this is not stop her diarrhea per report.  She denies any blood in the stool.  No abdominal pain associated.   LMP- October 27th, regular       Review Of Systems:  GEN- denies fatigue, fever, weight loss,weakness, recent illness HEENT- denies eye drainage, change in vision, nasal discharge, CVS- denies chest pain, palpitations RESP- denies SOB, cough, wheeze ABD- denies N/V, +change in stools, abd pain GU- denies dysuria, hematuria, dribbling, incontinence MSK- denies joint pain, muscle aches, injury Neuro- denies headache, dizziness, syncope, seizure activity       Objective:    BP 102/68   Pulse 60   Temp 97.9 F (36.6 C) (Oral)   Resp 16   Ht 4\' 11"  (1.499 m)   Wt 174 lb (78.9 kg)   SpO2 100%   BMI 35.14 kg/m  GEN- NAD, alert and oriented x3 HEENT- PERRL, EOMI, non injected sclera, pink conjunctiva, MMM, oropharynx clear Neck- Supple, no LAD CVS- RRR, no murmur RESP-CTAB ABD-NABS,soft,mild TTP epigastric, neg murphys,ND, no CVA tenderness  EXT- No edema Pulses- Radial 2+   EKG- NSR, no ST changes      Assessment & Plan:      Problem List Items Addressed This Visit    None    Visit Diagnoses    Atypical chest pain    -  Primary   I think this is GI related. Check labs, EKG reassuring. Given samples of nexium 20mg  from office. lomotil for diarrhea, no red flags on exam   Relevant Orders   CBC with Differential/Platelet   Comprehensive metabolic panel   EKG [id number] (Completed)   Lipase   Gastroesophageal reflux  disease without esophagitis       Relevant Medications   diphenoxylate-atropine (LOMOTIL) 2.5-0.025 MG tablet   Diarrhea, unspecified type       possible viral enteritis, only 2 weeks, not associated with food, but IBS on differential as well       Note: This dictation was prepared with Dragon dictation along with smaller phrase technology. Any transcriptional errors that result from this process are unintentional.

## 2017-03-01 ENCOUNTER — Telehealth: Payer: Self-pay | Admitting: *Deleted

## 2017-03-01 LAB — CBC WITH DIFFERENTIAL/PLATELET
BASOS PCT: 0.8 %
Basophils Absolute: 52 cells/uL (ref 0–200)
Eosinophils Absolute: 130 cells/uL (ref 15–500)
Eosinophils Relative: 2 %
HCT: 36.9 % (ref 35.0–45.0)
Hemoglobin: 12.4 g/dL (ref 11.7–15.5)
Lymphs Abs: 2600 cells/uL (ref 850–3900)
MCH: 28.7 pg (ref 27.0–33.0)
MCHC: 33.6 g/dL (ref 32.0–36.0)
MCV: 85.4 fL (ref 80.0–100.0)
MONOS PCT: 7.9 %
MPV: 11.1 fL (ref 7.5–12.5)
NEUTROS ABS: 3205 {cells}/uL (ref 1500–7800)
Neutrophils Relative %: 49.3 %
Platelets: 394 10*3/uL (ref 140–400)
RBC: 4.32 10*6/uL (ref 3.80–5.10)
RDW: 12 % (ref 11.0–15.0)
Total Lymphocyte: 40 %
WBC mixed population: 514 cells/uL (ref 200–950)
WBC: 6.5 10*3/uL (ref 3.8–10.8)

## 2017-03-01 LAB — COMPREHENSIVE METABOLIC PANEL
AG Ratio: 1.3 (calc) (ref 1.0–2.5)
ALBUMIN MSPROF: 4 g/dL (ref 3.6–5.1)
ALT: 33 U/L — ABNORMAL HIGH (ref 6–29)
AST: 29 U/L (ref 10–30)
Alkaline phosphatase (APISO): 66 U/L (ref 33–115)
BUN: 10 mg/dL (ref 7–25)
CHLORIDE: 104 mmol/L (ref 98–110)
CO2: 27 mmol/L (ref 20–32)
CREATININE: 0.65 mg/dL (ref 0.50–1.10)
Calcium: 9.5 mg/dL (ref 8.6–10.2)
GLOBULIN: 3.1 g/dL (ref 1.9–3.7)
GLUCOSE: 83 mg/dL (ref 65–99)
Potassium: 4 mmol/L (ref 3.5–5.3)
SODIUM: 138 mmol/L (ref 135–146)
TOTAL PROTEIN: 7.1 g/dL (ref 6.1–8.1)
Total Bilirubin: 0.3 mg/dL (ref 0.2–1.2)

## 2017-03-01 LAB — LIPASE: Lipase: 14 U/L (ref 7–60)

## 2017-03-01 NOTE — Telephone Encounter (Signed)
Received fax requesting alternative on Lomotil. Medication is not covered by [facility] Medicaid.   MD please advise.

## 2017-03-01 NOTE — Telephone Encounter (Signed)
unfortunatley I dont see any anti-diarrheal on the medicaid list You can see if Bentyl is covered, it is more anti-spasmodic, otherwise, she would need to continue the immodium only if > 3  Loose stools a day

## 2017-03-02 NOTE — Telephone Encounter (Signed)
Call placed to patient and patient made aware.   Patient will pay out of pocket for lomotil.

## 2017-03-06 NOTE — Telephone Encounter (Signed)
error 

## 2017-04-06 ENCOUNTER — Encounter: Payer: Medicaid Other | Admitting: Family Medicine

## 2017-06-05 ENCOUNTER — Encounter: Payer: Self-pay | Admitting: Family Medicine

## 2017-07-09 ENCOUNTER — Encounter: Payer: Self-pay | Admitting: Family Medicine

## 2018-06-13 ENCOUNTER — Encounter (HOSPITAL_COMMUNITY): Payer: Self-pay | Admitting: Emergency Medicine

## 2018-06-13 ENCOUNTER — Other Ambulatory Visit: Payer: Self-pay

## 2018-06-13 ENCOUNTER — Emergency Department (HOSPITAL_COMMUNITY): Payer: Medicaid Other

## 2018-06-13 ENCOUNTER — Emergency Department (HOSPITAL_COMMUNITY)
Admission: EM | Admit: 2018-06-13 | Discharge: 2018-06-13 | Disposition: A | Discharge status: Home / Self Care | Payer: Medicaid Other | Attending: Emergency Medicine | Admitting: Emergency Medicine

## 2018-06-13 DIAGNOSIS — R1030 Lower abdominal pain, unspecified: Secondary | ICD-10-CM | POA: Diagnosis not present

## 2018-06-13 DIAGNOSIS — N83202 Unspecified ovarian cyst, left side: Secondary | ICD-10-CM

## 2018-06-13 DIAGNOSIS — Z79899 Other long term (current) drug therapy: Secondary | ICD-10-CM | POA: Insufficient documentation

## 2018-06-13 DIAGNOSIS — N920 Excessive and frequent menstruation with regular cycle: Secondary | ICD-10-CM | POA: Diagnosis not present

## 2018-06-13 DIAGNOSIS — N939 Abnormal uterine and vaginal bleeding, unspecified: Secondary | ICD-10-CM | POA: Insufficient documentation

## 2018-06-13 DIAGNOSIS — R5383 Other fatigue: Secondary | ICD-10-CM | POA: Diagnosis not present

## 2018-06-13 LAB — CBC WITH DIFFERENTIAL/PLATELET
Abs Immature Granulocytes: 0.01 10*3/uL (ref 0.00–0.07)
Basophils Absolute: 0 10*3/uL (ref 0.0–0.1)
Basophils Relative: 1 %
EOS ABS: 0.2 10*3/uL (ref 0.0–0.5)
Eosinophils Relative: 3 %
HCT: 35.9 % — ABNORMAL LOW (ref 36.0–46.0)
Hemoglobin: 11.8 g/dL — ABNORMAL LOW (ref 12.0–15.0)
Immature Granulocytes: 0 %
Lymphocytes Relative: 38 %
Lymphs Abs: 2.2 10*3/uL (ref 0.7–4.0)
MCH: 29.1 pg (ref 26.0–34.0)
MCHC: 32.9 g/dL (ref 30.0–36.0)
MCV: 88.6 fL (ref 80.0–100.0)
MONO ABS: 0.4 10*3/uL (ref 0.1–1.0)
Monocytes Relative: 7 %
NEUTROS ABS: 3 10*3/uL (ref 1.7–7.7)
NRBC: 0 % (ref 0.0–0.2)
Neutrophils Relative %: 51 %
Platelets: 325 10*3/uL (ref 150–400)
RBC: 4.05 MIL/uL (ref 3.87–5.11)
RDW: 12.5 % (ref 11.5–15.5)
WBC: 5.8 10*3/uL (ref 4.0–10.5)

## 2018-06-13 LAB — POC URINE PREG, ED: Preg Test, Ur: NEGATIVE

## 2018-06-13 LAB — COMPREHENSIVE METABOLIC PANEL
ALT: 25 U/L (ref 0–44)
AST: 20 U/L (ref 15–41)
Albumin: 3.7 g/dL (ref 3.5–5.0)
Alkaline Phosphatase: 61 U/L (ref 38–126)
Anion gap: 7 (ref 5–15)
BUN: 11 mg/dL (ref 6–20)
CHLORIDE: 106 mmol/L (ref 98–111)
CO2: 24 mmol/L (ref 22–32)
Calcium: 8.9 mg/dL (ref 8.9–10.3)
Creatinine, Ser: 0.66 mg/dL (ref 0.44–1.00)
GFR calc Af Amer: >60 mL/min (ref >60)
GFR calc non Af Amer: >60 mL/min (ref >60)
Glucose, Bld: 96 mg/dL (ref 70–99)
Potassium: 4 mmol/L (ref 3.5–5.1)
SODIUM: 137 mmol/L (ref 135–145)
Total Bilirubin: 0.3 mg/dL (ref 0.3–1.2)
Total Protein: 7 g/dL (ref 6.5–8.1)

## 2018-06-13 LAB — WET PREP, GENITAL
Clue Cells Wet Prep HPF POC: NONE SEEN
Sperm: NONE SEEN
Trich, Wet Prep: NONE SEEN
Yeast Wet Prep HPF POC: NONE SEEN

## 2018-06-13 LAB — URINALYSIS, ROUTINE W REFLEX MICROSCOPIC
Bacteria, UA: NONE SEEN
Bilirubin Urine: NEGATIVE
GLUCOSE, UA: NEGATIVE mg/dL
Ketones, ur: NEGATIVE mg/dL
Leukocytes,Ua: NEGATIVE
Nitrite: NEGATIVE
Protein, ur: NEGATIVE mg/dL
RBC / HPF: >50 RBC/hpf — ABNORMAL HIGH (ref 0–5)
SPECIFIC GRAVITY, URINE: 1.012 (ref 1.005–1.030)
pH: 8 (ref 5.0–8.0)

## 2018-06-13 MED ORDER — IBUPROFEN 800 MG PO TABS: 800.0000 mg | ORAL_TABLET | Freq: Three times a day (TID) | ORAL | 0 refills | Status: DC

## 2018-06-13 MED ORDER — HYDROCODONE-ACETAMINOPHEN 5-325 MG PO TABS
ORAL_TABLET | ORAL | Status: AC
  Filled 2018-06-13: qty 1

## 2018-06-13 MED ORDER — HYDROCODONE-ACETAMINOPHEN 5-325 MG PO TABS
1.0000 | ORAL_TABLET | Freq: Once | ORAL | Status: AC
  Administered 2018-06-13: 1 via ORAL

## 2018-06-13 NOTE — ED Provider Notes (Signed)
[facility] EMERGENCY DEPARTMENT Provider Note   CSN: [id number] Arrival date & time: 06/13/18  3474    History   Chief Complaint Chief Complaint  Patient presents with  . Vaginal Bleeding    HPI Lindsey Beard is a 28 y.o. female.     HPI   Lindsey Beard is a 28 y.o. female with hx of irregular menses, presents to the Emergency Department complaining of heavy vaginal bleeding and passing large clots.  Symptoms have been present for 4 days.  Reports she is having to change her sanitary pad every 3 hrs.  She also complains of generalized fatigue.  no headaches, syncope or dizziness.  Her bleeding is also associated with sharp, intermittent pains from her lower abdomen to her rectal area.  She denies chills, fever, vomiting, diarrhea, and dysuria.  No change in appetite or bowel habits.  She does not currently use birth control.      Past Medical History:  Diagnosis Date  . Hx of chlamydia infection   . Hx of varicella   . Medical history non-contributory   . Pregnant     Patient Active Problem List   Diagnosis Date Noted  . Obesity 12/29/2015  . Routine general medical examination at a health care facility 12/29/2015  . Short stature 02/16/2014    Past Surgical History:  Procedure Laterality Date  . CESAREAN SECTION N/A 03/01/2014   Procedure: CESAREAN SECTION;  Surgeon: Maeola Sarah. Landry Mellow, MD;  Location: [facility];  Service: Obstetrics;  Laterality: N/A;  . dilitation and curretage    . NO PAST SURGERIES       OB History    Gravida  2   Para  1   Term  1   Preterm      AB  1   Living  1     SAB  1   TAB      Ectopic      Multiple  0   Live Births  1            Home Medications    Prior to Admission medications   Medication Sig Start Date End Date Taking? Authorizing Provider  diphenoxylate-atropine (LOMOTIL) 2.5-0.025 MG tablet Take 1 tablet 4 (four) times daily as needed by mouth for diarrhea or loose stools. 02/28/17   Alycia Rossetti, MD    Family History Family History  Problem Relation Age of Onset  . Cancer Maternal Aunt   . Diabetes Mother     Social History Social History   Tobacco Use  . Smoking status: Never Smoker  . Smokeless tobacco: Never Used  Substance Use Topics  . Alcohol use: No  . Drug use: No     Allergies   Patient has no known allergies.   Review of Systems Review of Systems  Constitutional: Negative for activity change, appetite change, chills and fever.  Respiratory: Negative for chest tightness and shortness of breath.   Cardiovascular: Negative for chest pain and leg swelling.  Gastrointestinal: Positive for abdominal pain. Negative for nausea and vomiting.  Genitourinary: Positive for menstrual problem, vaginal bleeding and vaginal pain. Negative for decreased urine volume, difficulty urinating, dysuria, flank pain, frequency, hematuria and urgency.  Musculoskeletal: Negative for back pain.  Skin: Negative for rash.  Neurological: Negative for dizziness, weakness and numbness.  Hematological: Negative for adenopathy.  Psychiatric/Behavioral: Negative for confusion.     Physical Exam Updated Vital Signs BP (!) 115/56   Pulse 64  Temp 98.5 F (36.9 C) (Oral)   Resp 18   Ht 4\' 11"  (1.499 m)   Wt 81.6 kg   LMP 06/07/2018   SpO2 98%   BMI 36.36 kg/m   Physical Exam Vitals signs and nursing note reviewed. Exam conducted with a chaperone present.  Constitutional:      Appearance: Normal appearance.  HENT:     Head: Atraumatic.     Mouth/Throat:     Mouth: Mucous membranes are moist.     Pharynx: Oropharynx is clear.  Neck:     Musculoskeletal: Normal range of motion. No muscular tenderness.  Cardiovascular:     Rate and Rhythm: Normal rate and regular rhythm.     Pulses: Normal pulses.  Pulmonary:     Effort: Pulmonary effort is normal. No respiratory distress.     Breath sounds: Normal breath sounds. No wheezing.  Abdominal:     Palpations: Abdomen is  soft.     Tenderness: There is abdominal tenderness.     Comments: Mild ttp of the entire lower abdomen.  No guarding or rebound tenderness.  Remaining abdomen is soft, non tender.   Genitourinary:    Exam position: Supine.     Labia:        Right: No rash or lesion.        Left: No rash or lesion.      Cervix: Cervical motion tenderness and cervical bleeding present.     Uterus: Tender.      Adnexa:        Right: No mass or tenderness.         Left: No mass or tenderness.       Comments: Small amt of blood in the vaginal vault with scant blood from the cervix.  No adnexal masses or tenderness.  No clots Musculoskeletal: Normal range of motion.  Lymphadenopathy:     Cervical: No cervical adenopathy.  Skin:    General: Skin is warm.     Capillary Refill: Capillary refill takes less than 2 seconds.     Findings: No rash.  Neurological:     General: No focal deficit present.     Mental Status: She is alert.     Sensory: No sensory deficit.     Motor: No weakness.      ED Treatments / Results  Labs (all labs ordered are listed, but only abnormal results are displayed) Labs Reviewed  WET PREP, GENITAL - Abnormal; Notable for the following components:      Result Value   WBC, Wet Prep HPF POC MODERATE (*)    All other components within normal limits  CBC WITH DIFFERENTIAL/PLATELET - Abnormal; Notable for the following components:   Hemoglobin 11.8 (*)    HCT 35.9 (*)    All other components within normal limits  COMPREHENSIVE METABOLIC PANEL  URINALYSIS, ROUTINE W REFLEX MICROSCOPIC  POC URINE PREG, ED  GC/CHLAMYDIA PROBE AMP ([place]) NOT AT [facility]    EKG None  Radiology US Pelvis Complete  Result Date: 06/13/2018 CLINICAL DATA:  Menorrhagia EXAM: TRANSABDOMINAL ULTRASOUND OF PELVIS TECHNIQUE: Transabdominal ultrasound examination of the pelvis was performed including evaluation of the uterus, ovaries, adnexal regions, and pelvic cul-de-sac. COMPARISON:  None.  FINDINGS: Uterus Measurements: 9.0 x 4.9 x 5.0 cm = volume: 116.9 mL. No fibroids or other mass visualized. Endometrium Thickness: 5.6 mm.  No focal abnormality visualized. Right ovary Measurements: 3.3 x 2.1 x 2.1 cm = volume: 7.3 mL. Normal appearance/no adnexal mass.  Left ovary Unable to visualize the left ovary. There is a left adnexal cystic lesion noted superior to the uterus on the left side which measures 7.5 x 6.0 x 8.3 cm. This appears to be a simple cyst. Other findings:  No free pelvic fluid collections. IMPRESSION: 1. Normal appearance of the uterus. No endometrial thickening or endometrial lesion is identified. 2. Normal right ovary. 3. The left ovary is not visualized for certain. There is a 7.5 x 6.0 x 8.3 cm simple appearing left adnexal cystic lesion. A repeat ultrasound examination in 3 months is suggested to reassess. Electronically Signed   By: Marijo Sanes M.D.   On: 06/13/2018 11:52    Procedures Procedures (including critical care time)  Medications Ordered in ED Medications - No data to display   Initial Impression / Assessment and Plan / ED Course  I have reviewed the triage vital signs and the nursing notes.  Pertinent labs & imaging results that were available during my care of the patient were reviewed by me and considered in my medical decision making (see chart for details).        Pt well appearing, ambulated to restroom and gait is steady.  No dizziness upon standing.    On recheck, patient reports feeling better.  Discussed ultrasound findings and importance of recheck in 3 months.  Labs reassuring.  Patient agrees to plan, and return precautions discussed.  Final Clinical Impressions(s) / ED Diagnoses   Final diagnoses:  Abnormal vaginal bleeding  Cyst of left ovary    ED Discharge Orders    None       Kem Parkinson, PA-C 06/14/18 1119    Elnora Morrison, MD 06/16/18 [phone]

## 2018-06-13 NOTE — Discharge Instructions (Addendum)
Apply warm heat off and on to your lower abdomen.  Take the ibuprofen as directed if needed for pain.  As discussed, you have a cyst on your left ovary that will need to be reevaluated in approximately 3 months.  You may contact [facility] to arrange a follow-up appointment or follow-up with your primary doctor if needed.

## 2018-06-13 NOTE — ED Triage Notes (Signed)
Heavy vaginal bleeding with clots x past 4 days.  Reports changing pad x 3 per hour.

## 2018-06-13 NOTE — ED Notes (Signed)
Pts urine sample has blood clot in it, unable to use for UA, pt states she will try again asap to get cleaner sample

## 2018-06-14 LAB — GC/CHLAMYDIA PROBE AMP (~~LOC~~) NOT AT ARMC
Chlamydia: NEGATIVE
Neisseria Gonorrhea: NEGATIVE

## 2018-06-15 LAB — URINE CULTURE: Culture: NO GROWTH

## 2018-08-13 DIAGNOSIS — Z3009 Encounter for other general counseling and advice on contraception: Secondary | ICD-10-CM | POA: Diagnosis not present

## 2018-08-13 DIAGNOSIS — Z114 Encounter for screening for human immunodeficiency virus [HIV]: Secondary | ICD-10-CM | POA: Diagnosis not present

## 2018-08-13 DIAGNOSIS — Z0389 Encounter for observation for other suspected diseases and conditions ruled out: Secondary | ICD-10-CM | POA: Diagnosis not present

## 2018-08-13 DIAGNOSIS — Z113 Encounter for screening for infections with a predominantly sexual mode of transmission: Secondary | ICD-10-CM | POA: Diagnosis not present

## 2018-08-13 DIAGNOSIS — Z1388 Encounter for screening for disorder due to exposure to contaminants: Secondary | ICD-10-CM | POA: Diagnosis not present

## 2018-12-02 ENCOUNTER — Encounter: Payer: Self-pay | Admitting: Obstetrics & Gynecology

## 2018-12-02 ENCOUNTER — Ambulatory Visit (INDEPENDENT_AMBULATORY_CARE_PROVIDER_SITE_OTHER): Payer: Medicaid Other | Admitting: Obstetrics & Gynecology

## 2018-12-02 ENCOUNTER — Other Ambulatory Visit: Payer: Self-pay

## 2018-12-02 VITALS — BP 106/80 | HR 61 | Ht 59.0 in | Wt 175.5 lb

## 2018-12-02 DIAGNOSIS — N83202 Unspecified ovarian cyst, left side: Secondary | ICD-10-CM

## 2018-12-02 NOTE — Progress Notes (Signed)
Chief Complaint  Patient presents with  . cyst on left ovary    was seen in [place] in Feb.       28 y.o. G2P1011 Patient's last menstrual period was 11/01/2018 (approximate). The current method of family planning is none.  Outpatient Encounter Medications as of 12/02/2018  Medication Sig  . Acetaminophen (TYLENOL PO) Take by mouth as needed.  . [DISCONTINUED] diphenoxylate-atropine (LOMOTIL) 2.5-0.025 MG tablet Take 1 tablet 4 (four) times daily as needed by mouth for diarrhea or loose stools. (Patient not taking: Reported on 06/13/2018)  . [DISCONTINUED] ibuprofen (ADVIL,MOTRIN) 800 MG tablet Take 1 tablet (800 mg total) by mouth 3 (three) times daily.   No facility-administered encounter medications on file as of 12/02/2018.     Subjective Had sonogram via ED visit 2/20 and had 7.5 cm cyst simple at that time No follow up since Some occasional crampy discomfort on left side Has constipation and GI symptoms which are unrelated it would seem  Will need to repeat scan in the 6 month interval for evaluation Past Medical History:  Diagnosis Date  . Hx of chlamydia infection   . Hx of varicella   . Medical history non-contributory   . Pregnant     Past Surgical History:  Procedure Laterality Date  . CESAREAN SECTION N/A 03/01/2014   Procedure: CESAREAN SECTION;  Surgeon: Maeola Sarah. Landry Mellow, MD;  Location: [facility];  Service: Obstetrics;  Laterality: N/A;  . dilitation and curretage    . NO PAST SURGERIES      OB History    Gravida  2   Para  1   Term  1   Preterm      AB  1   Living  1     SAB  1   TAB      Ectopic      Multiple  0   Live Births  1           No Known Allergies  Social History   Socioeconomic History  . Marital status: Single    Spouse name: Not on file  . Number of children: Not on file  . Years of education: Not on file  . Highest education level: Not on file  Occupational History  . Not on file  Social Needs  .  Financial resource strain: Not on file  . Food insecurity    Worry: Not on file    Inability: Not on file  . Transportation needs    Medical: Not on file    Non-medical: Not on file  Tobacco Use  . Smoking status: Never Smoker  . Smokeless tobacco: Never Used  Substance and Sexual Activity  . Alcohol use: No  . Drug use: No  . Sexual activity: Yes    Birth control/protection: None  Lifestyle  . Physical activity    Days per week: Not on file    Minutes per session: Not on file  . Stress: Not on file  Relationships  . Social Herbalist on phone: Not on file    Gets together: Not on file    Attends religious service: Not on file    Active member of club or organization: Not on file    Attends meetings of clubs or organizations: Not on file    Relationship status: Not on file  Other Topics Concern  . Not on file  Social History Narrative  . Not on file  Family History  Problem Relation Age of Onset  . Cancer Maternal Aunt   . Diabetes Mother   . Gout Maternal Grandfather     Medications:       Current Outpatient Medications:  .  Acetaminophen (TYLENOL PO), Take by mouth as needed., Disp: , Rfl:   Objective Blood pressure 106/80, pulse 61, height 4\' 11"  (1.499 m), weight 175 lb 8 oz (79.6 kg), last menstrual period 11/01/2018, not currently breastfeeding.  Gen WDWN NAD  Pertinent ROS No burning with urination, frequency or urgency No nausea, vomiting or diarrhea Nor fever chills or other constitutional symptoms   Labs or studies Reviewed from 2/20    Impression Diagnoses this Encounter::   ICD-10-CM   1. Cyst of left ovary  N83.202 US PELVIS TRANSVAGINAL NON-OB (TV ONLY)    US PELVIS (TRANSABDOMINAL ONLY)    Established relevant diagnosis(es):   Plan/Recommendations: No orders of the defined types were placed in this encounter.   Labs or Scans Ordered: Orders Placed This Encounter  Procedures  . US PELVIS TRANSVAGINAL NON-OB (TV  ONLY)  . US PELVIS (TRANSABDOMINAL ONLY)    Management:: >interval follow up scan to track the cyst and then make appropriate management decisions based on sonogram  Follow up Return in about 2 weeks (around 12/16/2018) for GYN sono, Follow up, with Dr Elonda Husky.        Face to face time:  20 minutes  Greater than 50% of the visit time was spent in counseling and coordination of care with the patient.  The summary and outline of the counseling and care coordination is summarized in the note above.   All questions were answered.

## 2018-12-16 ENCOUNTER — Ambulatory Visit (INDEPENDENT_AMBULATORY_CARE_PROVIDER_SITE_OTHER): Payer: Medicaid Other

## 2018-12-16 ENCOUNTER — Other Ambulatory Visit: Payer: Self-pay

## 2018-12-16 ENCOUNTER — Encounter: Payer: Self-pay | Admitting: Obstetrics & Gynecology

## 2018-12-16 ENCOUNTER — Ambulatory Visit: Payer: Medicaid Other | Admitting: Obstetrics & Gynecology

## 2018-12-16 ENCOUNTER — Ambulatory Visit (INDEPENDENT_AMBULATORY_CARE_PROVIDER_SITE_OTHER): Payer: Medicaid Other | Admitting: Obstetrics & Gynecology

## 2018-12-16 VITALS — BP 104/61 | HR 66 | Ht 59.0 in | Wt 173.5 lb

## 2018-12-16 DIAGNOSIS — N83202 Unspecified ovarian cyst, left side: Secondary | ICD-10-CM | POA: Diagnosis not present

## 2018-12-16 NOTE — Progress Notes (Signed)
Follow up appointment for results  Chief Complaint  Patient presents with  . discuss [place]    Blood pressure 104/61, pulse 66, height 4\' 11"  (1.499 m), weight 173 lb 8 oz (78.7 kg), last menstrual period 12/13/2018.  US Pelvis Transvaginal Non-ob (tv Only)  Result Date: 12/16/2018 GYNECOLOGIC SONOGRAM Lindsey Beard is a 28 y.o. G2P1011 LMP 12/13/2018,she is here for a pelvic sonogram to f/u 8 cm left ovarian cyst. Uterus                      7.2 x 4.2 x 5.6 cm, Total uterine volume 89 cc, homogeneous anteverted uterus,wnl,small simple nabothian cyst 8 mm Endometrium          4.8 mm, symmetrical, wnl Right ovary             3.8 x 1.9 x 2.2 cm, wnl Left ovary                3.5 x 2.9 x 3.9 cm, hemorrhagic left ovarian cyst 2.5 x 2 x 2.3 cm No free fluid Technician Comments: PELVIC US TA/TV: homogeneous anteverted uterus,wnl,small simple nabothian cyst 8 mm,EEC 4.8 mm,normal right ovary,complex hemorrhagic left ovarian cyst 2.5 x 2 x 2.3 cm,no free fluid,ovaries appear mobile,no pain during ultrasound Chaperone: Dianne Dun 12/16/2018 4:08 PM [place] comparison 05/2018: Clinical Impression and recommendations: I have reviewed the sonogram results above, combined with the patient's current clinical course, below are my impressions and any appropriate recommendations for management based on the sonographic findings. Uterus and endometrium are normal Right ovary is normal resolved left ovarian cyst from 05/2018, small physiologic cyst, simple cyst left ovary currently which is a different finding,  definitely not the same as previously Florian Buff 12/16/2018 4:19 PM  US Pelvis (transabdominal Only)  Result Date: 12/16/2018 GYNECOLOGIC SONOGRAM Lindsey Beard is a 28 y.o. G2P1011 LMP 12/13/2018,she is here for a pelvic sonogram to f/u 8 cm left ovarian cyst. Uterus                      7.2 x 4.2 x 5.6 cm, Total uterine volume 89 cc, homogeneous anteverted uterus,wnl,small simple nabothian cyst 8 mm  Endometrium          4.8 mm, symmetrical, wnl Right ovary             3.8 x 1.9 x 2.2 cm, wnl Left ovary                3.5 x 2.9 x 3.9 cm, hemorrhagic left ovarian cyst 2.5 x 2 x 2.3 cm No free fluid Technician Comments: PELVIC US TA/TV: homogeneous anteverted uterus,wnl,small simple nabothian cyst 8 mm,EEC 4.8 mm,normal right ovary,complex hemorrhagic left ovarian cyst 2.5 x 2 x 2.3 cm,no free fluid,ovaries appear mobile,no pain during ultrasound Chaperone: Dianne Dun 12/16/2018 4:08 PM [place] comparison 05/2018: Clinical Impression and recommendations: I have reviewed the sonogram results above, combined with the patient's current clinical course, below are my impressions and any appropriate recommendations for management based on the sonographic findings. Uterus and endometrium are normal Right ovary is normal resolved left ovarian cyst from 05/2018, small physiologic cyst, simple cyst left ovary currently which is a different finding,  definitely not the same as previously Florian Buff 12/16/2018 4:19 PM     MEDS ordered this encounter: No orders of the defined types were placed in this encounter.   Orders for this encounter: No orders  of the defined types were placed in this encounter.   Impression:   ICD-10-CM   1. Cyst of left ovary, resolved  N83.202    New physiologic cyst not the same cyst resolution of previous 8 cm cyst from 05/2018 scan     Plan: No follow up needed  Follow Up: No follow-ups on file.       Face to face time:  10 minutes  Greater than 50% of the visit time was spent in counseling and coordination of care with the patient.  The summary and outline of the counseling and care coordination is summarized in the note above.   All questions were answered.  Past Medical History:  Diagnosis Date  . Hx of chlamydia infection   . Hx of varicella   . Medical history non-contributory   . Pregnant     Past Surgical History:  Procedure Laterality  Date  . CESAREAN SECTION N/A 03/01/2014   Procedure: CESAREAN SECTION;  Surgeon: Maeola Sarah. Landry Mellow, MD;  Location: [facility];  Service: Obstetrics;  Laterality: N/A;  . dilitation and curretage    . NO PAST SURGERIES      OB History    Gravida  2   Para  1   Term  1   Preterm      AB  1   Living  1     SAB  1   TAB      Ectopic      Multiple  0   Live Births  1           No Known Allergies  Social History   Socioeconomic History  . Marital status: Single    Spouse name: Not on file  . Number of children: Not on file  . Years of education: Not on file  . Highest education level: Not on file  Occupational History  . Not on file  Social Needs  . Financial resource strain: Not on file  . Food insecurity    Worry: Not on file    Inability: Not on file  . Transportation needs    Medical: Not on file    Non-medical: Not on file  Tobacco Use  . Smoking status: Never Smoker  . Smokeless tobacco: Never Used  Substance and Sexual Activity  . Alcohol use: No  . Drug use: No  . Sexual activity: Yes    Birth control/protection: None  Lifestyle  . Physical activity    Days per week: Not on file    Minutes per session: Not on file  . Stress: Not on file  Relationships  . Social Herbalist on phone: Not on file    Gets together: Not on file    Attends religious service: Not on file    Active member of club or organization: Not on file    Attends meetings of clubs or organizations: Not on file    Relationship status: Not on file  Other Topics Concern  . Not on file  Social History Narrative  . Not on file    Family History  Problem Relation Age of Onset  . Cancer Maternal Aunt   . Diabetes Mother   . Gout Maternal Grandfather

## 2018-12-16 NOTE — Progress Notes (Signed)
PELVIC US TA/TV: homogeneous anteverted uterus,wnl,small simple nabothian cyst 8 mm,EEC 4.8 mm,normal right ovary,complex hemorrhagic left ovarian cyst 2.5 x 2 x 2.3 cm,no free fluid,ovaries appear mobile,no pain during ultrasound Chaperone: Estill Bamberg

## 2019-10-19 ENCOUNTER — Ambulatory Visit
Admission: EM | Admit: 2019-10-19 | Discharge: 2019-10-19 | Disposition: A | Discharge status: Home / Self Care | Payer: Medicaid Other | Attending: Emergency Medicine | Admitting: Emergency Medicine

## 2019-10-19 ENCOUNTER — Other Ambulatory Visit: Payer: Self-pay

## 2019-10-19 DIAGNOSIS — J020 Streptococcal pharyngitis: Secondary | ICD-10-CM

## 2019-10-19 LAB — POCT RAPID STREP A (OFFICE): Rapid Strep A Screen: POSITIVE — AB

## 2019-10-19 MED ORDER — MENTHOL 3 MG MT LOZG: 1.0000 | LOZENGE | OROMUCOSAL | 0 refills | Status: DC | PRN

## 2019-10-19 MED ORDER — AMOXICILLIN 500 MG PO CAPS: 500.0000 mg | ORAL_CAPSULE | Freq: Two times a day (BID) | ORAL | 0 refills | Status: AC

## 2019-10-19 NOTE — Discharge Instructions (Addendum)
Strep was positive.  Push fluids and get rest Prescribed amoxicillin 500mg  twice daily for 10 days.  Take as directed and to completion.  Drink warm or cool liquids, use throat lozenges, or popsicles to help alleviate symptoms Take OTC ibuprofen or tylenol as needed for pain Follow up with PCP if symptoms persist Return or go to ER if you have any new or worsening symptoms such as fever, chills, nausea, vomiting, worsening sore throat, cough, abdominal pain, chest pain, changes in bowel or bladder habits, etc..Marland Kitchen

## 2019-10-19 NOTE — ED Provider Notes (Signed)
[facility]   [id number] 10/19/19 Arrival Time: 2876  Chief Complaint  Patient presents with   Sore Throat     SUBJECTIVE: History from: patient.  Lindsey Beard is a 29 y.o. female who presents to the urgent care for complaint of sore throat for the past few days.  Denies sick exposure to strep, flu or mono, but reports she has been bitting her nail.  Has tried OTC medication without relief.  Symptoms are made worse with swallowing, but tolerating liquids and own secretions without difficulty.  Denies previous symptoms in the past.   Denies fever, chills, fatigue, ear pain, sinus pain, rhinorrhea, nasal congestion, cough, SOB, wheezing, chest pain, nausea, rash, changes in bowel or bladder habits.    ROS: As per HPI.  All other pertinent ROS negative.      Past Medical History:  Diagnosis Date   Hx of chlamydia infection    Hx of varicella    Medical history non-contributory    Pregnant    Past Surgical History:  Procedure Laterality Date   CESAREAN SECTION N/A 03/01/2014   Procedure: CESAREAN SECTION;  Surgeon: Maeola Sarah. Landry Mellow, MD;  Location: [facility];  Service: Obstetrics;  Laterality: N/A;   dilitation and curretage     NO PAST SURGERIES     No Known Allergies No current facility-administered medications on file prior to encounter.   Current Outpatient Medications on File Prior to Encounter  Medication Sig Dispense Refill   Acetaminophen (TYLENOL PO) Take by mouth as needed.     Social History   Socioeconomic History   Marital status: Single    Spouse name: Not on file   Number of children: Not on file   Years of education: Not on file   Highest education level: Not on file  Occupational History   Not on file  Tobacco Use   Smoking status: Never Smoker   Smokeless tobacco: Never Used  Vaping Use   Vaping Use: Never used  Substance and Sexual Activity   Alcohol use: No   Drug use: No   Sexual activity: Yes    Birth  control/protection: None  Other Topics Concern   Not on file  Social History Narrative   Not on file   Social Determinants of Health   Financial Resource Strain:    Difficulty of Paying Living Expenses:   Food Insecurity:    Worried About Charity fundraiser in the Last Year:    Arboriculturist in the Last Year:   Transportation Needs:    Film/video editor (Medical):    Lack of Transportation (Non-Medical):   Physical Activity:    Days of Exercise per Week:    Minutes of Exercise per Session:   Stress:    Feeling of Stress :   Social Connections:    Frequency of Communication with Friends and Family:    Frequency of Social Gatherings with Friends and Family:    Attends Religious Services:    Active Member of Clubs or Organizations:    Attends Music therapist:    Marital Status:   Intimate Partner Violence:    Fear of Current or Ex-Partner:    Emotionally Abused:    Physically Abused:    Sexually Abused:    Family History  Problem Relation Age of Onset   Cancer Maternal Aunt    Diabetes Mother    Gout Maternal Grandfather     OBJECTIVE:  Vitals:   10/19/19 1230  BP: 116/73  Pulse: (!) 55  Resp: 15  Temp: 98.3 F (36.8 C)  TempSrc: Oral  SpO2: 98%     General appearance: alert; appears fatigued, but nontoxic, speaking in full sentences and managing own secretions HEENT: NCAT; Ears: EACs clear, TMs pearly gray with visible cone of light, without erythema; Eyes: PERRL, EOMI grossly; Nose: no obvious rhinorrhea; Throat: oropharynx clear, tonsils 2+ and mildly erythematous without white tonsillar exudates, uvula midline Neck: supple without LAD Lungs: CTA bilaterally without adventitious breath sounds; cough absent Heart: regular rate and rhythm.  Radial pulses 2+ symmetrical bilaterally Skin: warm and dry Psychological: alert and cooperative; normal mood and affect  LABS: Results for orders placed or performed during  the hospital encounter of 10/19/19 (from the past 24 hour(s))  POCT rapid strep A     Status: Abnormal   Collection Time: 10/19/19  1:08 PM  Result Value Ref Range   Rapid Strep A Screen Positive (A) Negative     ASSESSMENT & PLAN:  1. Strep pharyngitis     Meds ordered this encounter  Medications   amoxicillin (AMOXIL) 500 MG capsule    Sig: Take 1 capsule (500 mg total) by mouth 2 (two) times daily for 10 days.    Dispense:  20 capsule    Refill:  0   menthol-cetylpyridinium (CEPACOL) 3 MG lozenge    Sig: Take 1 lozenge (3 mg total) by mouth as needed for sore throat.    Dispense:  100 tablet    Refill:  0   Discharge instructions Strep was positive.  Push fluids and get rest Prescribed amoxicillin 500mg  twice daily for 10 days.  Take as directed and to completion.  Drink warm or cool liquids, use throat lozenges, or popsicles to help alleviate symptoms Take OTC ibuprofen or tylenol as needed for pain Follow up with PCP if symptoms persist Return or go to ER if you have any new or worsening symptoms such as fever, chills, nausea, vomiting, worsening sore throat, cough, abdominal pain, chest pain, changes in bowel or bladder habits, etc...   Reviewed expectations re: course of current medical issues. Questions answered. Outlined signs and symptoms indicating need for more acute intervention. Patient verbalized understanding. After Visit Summary given.     Note: This document was prepared using Dragon voice recognition software and may include unintentional dictation errors.    Emerson Monte, FNP 10/19/19 1318

## 2019-10-19 NOTE — ED Triage Notes (Signed)
Pt presents with c/o sore throat. Pt thinks she swallowed portion of nail after biting them

## 2019-11-14 DIAGNOSIS — Z114 Encounter for screening for human immunodeficiency virus [HIV]: Secondary | ICD-10-CM | POA: Diagnosis not present

## 2019-11-14 DIAGNOSIS — Z202 Contact with and (suspected) exposure to infections with a predominantly sexual mode of transmission: Secondary | ICD-10-CM | POA: Diagnosis not present

## 2019-11-14 DIAGNOSIS — N760 Acute vaginitis: Secondary | ICD-10-CM | POA: Diagnosis not present

## 2019-11-14 DIAGNOSIS — Z113 Encounter for screening for infections with a predominantly sexual mode of transmission: Secondary | ICD-10-CM | POA: Diagnosis not present

## 2021-04-22 ENCOUNTER — Encounter (HOSPITAL_COMMUNITY): Payer: Self-pay

## 2021-04-22 ENCOUNTER — Emergency Department (HOSPITAL_COMMUNITY)
Admission: EM | Admit: 2021-04-22 | Discharge: 2021-04-23 | Disposition: A | Discharge status: Home / Self Care | Payer: Medicaid Other | Attending: Emergency Medicine | Admitting: Emergency Medicine

## 2021-04-22 ENCOUNTER — Emergency Department (HOSPITAL_COMMUNITY): Payer: Medicaid Other

## 2021-04-22 ENCOUNTER — Other Ambulatory Visit: Payer: Self-pay

## 2021-04-22 ENCOUNTER — Ambulatory Visit
Admission: EM | Admit: 2021-04-22 | Discharge: 2021-04-22 | Payer: Medicaid Other | Attending: Urgent Care | Admitting: Urgent Care

## 2021-04-22 DIAGNOSIS — W01198A Fall on same level from slipping, tripping and stumbling with subsequent striking against other object, initial encounter: Secondary | ICD-10-CM | POA: Diagnosis not present

## 2021-04-22 DIAGNOSIS — R42 Dizziness and giddiness: Secondary | ICD-10-CM | POA: Diagnosis not present

## 2021-04-22 DIAGNOSIS — S0990XA Unspecified injury of head, initial encounter: Secondary | ICD-10-CM

## 2021-04-22 DIAGNOSIS — R55 Syncope and collapse: Secondary | ICD-10-CM | POA: Insufficient documentation

## 2021-04-22 LAB — BASIC METABOLIC PANEL
Anion gap: 7 (ref 5–15)
BUN: 11 mg/dL (ref 6–20)
CO2: 26 mmol/L (ref 22–32)
Calcium: 8.7 mg/dL — ABNORMAL LOW (ref 8.9–10.3)
Chloride: 103 mmol/L (ref 98–111)
Creatinine, Ser: 0.7 mg/dL (ref 0.44–1.00)
GFR, Estimated: >60 mL/min (ref >60)
Glucose, Bld: 92 mg/dL (ref 70–99)
Potassium: 3.9 mmol/L (ref 3.5–5.1)
Sodium: 136 mmol/L (ref 135–145)

## 2021-04-22 LAB — CBC WITH DIFFERENTIAL/PLATELET
Abs Immature Granulocytes: 0.01 10*3/uL (ref 0.00–0.07)
Basophils Absolute: 0 10*3/uL (ref 0.0–0.1)
Basophils Relative: 1 %
Eosinophils Absolute: 0.1 10*3/uL (ref 0.0–0.5)
Eosinophils Relative: 2 %
HCT: 35.7 % — ABNORMAL LOW (ref 36.0–46.0)
Hemoglobin: 12 g/dL (ref 12.0–15.0)
Immature Granulocytes: 0 %
Lymphocytes Relative: 46 %
Lymphs Abs: 3.3 10*3/uL (ref 0.7–4.0)
MCH: 29.8 pg (ref 26.0–34.0)
MCHC: 33.6 g/dL (ref 30.0–36.0)
MCV: 88.6 fL (ref 80.0–100.0)
Monocytes Absolute: 0.6 10*3/uL (ref 0.1–1.0)
Monocytes Relative: 9 %
Neutro Abs: 3 10*3/uL (ref 1.7–7.7)
Neutrophils Relative %: 42 %
Platelets: 321 10*3/uL (ref 150–400)
RBC: 4.03 MIL/uL (ref 3.87–5.11)
RDW: 11.9 % (ref 11.5–15.5)
WBC: 7.2 10*3/uL (ref 4.0–10.5)
nRBC: 0 % (ref 0.0–0.2)

## 2021-04-22 LAB — POCT URINE PREGNANCY: Preg Test, Ur: NEGATIVE

## 2021-04-22 NOTE — Discharge Instructions (Addendum)
Please report to the hospital now for more testing given your syncope and collapse as you are in need of a higher level of care than we can provide. This will likely include a head CT scan and blood work. You will be evaluated at [facility] by a different team and they will continue your evaluation and treatment.

## 2021-04-22 NOTE — ED Triage Notes (Addendum)
presenting for 2 day history of acute onset dizziness, lightheadedness, nausea without vomiting. Patient was eating dinner yesterday, felt warm, lightheaded and went outside to get fresh air. She continued to have symptoms outside, as she tried to go back in the home, she fainted and passed out hitting her head. Patient's fall was not witnessed, pt reports unsure if she had loss of consciousness, "maybe a minute" Thereafter, patient reports that she had an out of body experience.  Her family tried to get her to go to the hospital last night but she refused.  pt sent by urgent care

## 2021-04-22 NOTE — ED Triage Notes (Signed)
Pt reports lightheaded, nausea, feeling hot x 2 days. Pt reports she was told by a family  member she pass out yesterday.

## 2021-04-22 NOTE — ED Provider Notes (Signed)
[facility]   MRN: [id number] DOB: 11/30/1990  Subjective:   Lindsey Beard is a 31 y.o. female presenting for 2 day history of acute onset dizziness, lightheadedness, nausea without vomiting. Patient was eating dinner yesterday, felt warm, lightheaded and went outside to get fresh air. She continued to have symptoms outside, as she tried to go back in the home, she fainted and passed out hitting her head. Patient's fall was not witnessed, had loss of consciousness of about 1 minute.  Thereafter, patient reports that she had an out of body experience.  Her family tried to get her to go to the hospital last night but she refused.  She presents today for recommendations.  No history of previous syncope, neurologic events, cardiopulmonary events.  No history of arrhythmias.  She does have a sibling that had a similar occurrence and turned out to have seizures.  Patient denies any history of this.  She is not opposed to pregnancy test.  No current facility-administered medications for this encounter.  Current Outpatient Medications:    Acetaminophen (TYLENOL PO), Take by mouth as needed., Disp: , Rfl:    menthol-cetylpyridinium (CEPACOL) 3 MG lozenge, Take 1 lozenge (3 mg total) by mouth as needed for sore throat., Disp: 100 tablet, Rfl: 0   No Known Allergies  Past Medical History:  Diagnosis Date   Hx of chlamydia infection    Hx of varicella    Medical history non-contributory    Pregnant      Past Surgical History:  Procedure Laterality Date   CESAREAN SECTION N/A 03/01/2014   Procedure: CESAREAN SECTION;  Surgeon: Maeola Sarah. Landry Mellow, MD;  Location: [facility];  Service: Obstetrics;  Laterality: N/A;   dilitation and curretage     NO PAST SURGERIES      Family History  Problem Relation Age of Onset   Diabetes Mother    Diabetes Maternal Grandmother    Gout Maternal Grandfather    Cancer Maternal Aunt     Social History   Tobacco Use   Smoking status: Never    Smokeless tobacco: Never  Vaping Use   Vaping Use: Never used  Substance Use Topics   Alcohol use: Yes   Drug use: Never    ROS   Objective:   Vitals: BP 137/83 (BP Location: Right Arm)    Pulse 86    Temp 98.5 F (36.9 C) (Oral)    Resp 16    LMP 03/17/2021 (Exact Date)    SpO2 94%   Physical Exam Constitutional:      General: She is not in acute distress.    Appearance: Normal appearance. She is well-developed. She is not ill-appearing, toxic-appearing or diaphoretic.  HENT:     Head: Normocephalic and atraumatic.     Nose: Nose normal.     Mouth/Throat:     Mouth: Mucous membranes are moist.  Eyes:     General: No scleral icterus.       Right eye: No discharge.        Left eye: No discharge.     Extraocular Movements: Extraocular movements intact.     Conjunctiva/sclera: Conjunctivae normal.     Pupils: Pupils are equal, round, and reactive to light.  Cardiovascular:     Rate and Rhythm: Normal rate and regular rhythm.     Pulses: Normal pulses.     Heart sounds: Normal heart sounds. No murmur heard.   No friction rub. No gallop.  Pulmonary:  Effort: Pulmonary effort is normal. No respiratory distress.     Breath sounds: Normal breath sounds. No stridor. No wheezing, rhonchi or rales.  Skin:    General: Skin is warm and dry.     Findings: No rash.  Neurological:     Mental Status: She is alert and oriented to person, place, and time.     Cranial Nerves: No cranial nerve deficit.     Motor: No weakness.     Coordination: Coordination normal.     Gait: Gait normal.  Psychiatric:        Mood and Affect: Mood normal.        Behavior: Behavior normal.        Thought Content: Thought content normal.        Judgment: Judgment normal.    Results for orders placed or performed during the hospital encounter of 04/22/21 (from the past 24 hour(s))  POCT urine pregnancy     Status: None   Collection Time: 04/22/21  6:38 PM  Result Value Ref Range   Preg Test, Ur  Negative Negative    Assessment and Plan :   PDMP not reviewed this encounter.  1. Syncope and collapse    Patient has a very concerning symptoms that and wants further evaluation and intervention than we can provide in the urgent care setting.  She has a clear cardiopulmonary exam and normal neurologic exam in clinic.  However given her complete syncope and collapse with ongoing symptoms of dizziness and lightheadedness recommended further evaluation through the hospital.  Patient presents with a family member who contracts for safety and will take her there now.   Jaynee Eagles, [place] 04/22/21 1856

## 2021-04-22 NOTE — ED Notes (Signed)
Patient is being discharged from the Urgent Care and sent to the Emergency Department via POV . Per [place], [place] patient is in need of higher level of care due to syncope episodes. Patient is aware and verbalizes understanding of plan of care.  Vitals:   04/22/21 1825  BP: 137/83  Pulse: 86  Resp: 16  Temp: 98.5 F (36.9 C)  SpO2: 94%

## 2021-04-22 NOTE — ED Provider Notes (Signed)
[facility] EMERGENCY DEPARTMENT Provider Note   CSN: [id number] Arrival date & time: 04/22/21  1909     History  Chief Complaint  Patient presents with   Dizziness    Pt reports "passing out last night"-    Lindsey Beard is a 31 y.o. female presenting with complaint of syncope which occurred yesterday.  She describes sitting down to eat dinner with her family when she started to feel hot and nauseated, got up to get some fresh air outside where she passed out of the porch.  She reports prior episodes of lightheadedness which will pass with taking deep breaths, but has not passed prior to last night.  She denies sob, chest pain, palpitations, abdominal pain or headache with these episodes.  She did hit her left forehead on the porch when she fell last night and continues to have a mild headache. She reports changes in her vision while these episodes are occurring but is unable to describe.  Currently her vision is normal. Denies fevers or chills, neck pain or stiffness.  No history of migraine headache.  She was seen at our urgent care where she had a negative pregnancy test, then was advised to present here for further testing. She questions if increased stress can cause you to pass out which she does endorse, but was not feeling particularly stressed at the time of last nights event. She denies panic or hyperventilation.  The history is provided by the patient and a relative.      Home Medications Prior to Admission medications   Medication Sig Start Date End Date Taking? Authorizing Provider  Acetaminophen (TYLENOL PO) Take by mouth as needed.   Yes [provider]  menthol-cetylpyridinium (CEPACOL) 3 MG lozenge Take 1 lozenge (3 mg total) by mouth as needed for sore throat. Patient not taking: Reported on 04/22/2021 10/19/19   Emerson Monte, FNP      Allergies    Patient has no known allergies.    Review of Systems   Review of Systems  Constitutional: Negative.   Negative for fever.  HENT:  Negative for congestion and sore throat.   Eyes:  Positive for visual disturbance.       Transient visual disturbance with episodes.  Respiratory:  Negative for chest tightness and shortness of breath.   Cardiovascular:  Negative for chest pain.  Gastrointestinal:  Negative for abdominal pain and nausea.  Genitourinary: Negative.   Musculoskeletal:  Negative for arthralgias, joint swelling and neck pain.  Skin: Negative.  Negative for rash and wound.  Neurological:  Positive for syncope and headaches. Negative for dizziness, weakness, light-headedness and numbness.  Psychiatric/Behavioral: Negative.    All other systems reviewed and are negative.  Physical Exam Updated Vital Signs BP 123/74    Pulse 68    Temp 98.1 F (36.7 C) (Oral)    Resp 17    Ht 4\' 11"  (1.499 m)    Wt 81.6 kg    SpO2 98%    BMI 36.36 kg/m  Physical Exam Vitals and nursing note reviewed.  Constitutional:      Appearance: She is well-developed.  HENT:     Head: Normocephalic and atraumatic.  Eyes:     Extraocular Movements: Extraocular movements intact.     Pupils: Pupils are equal, round, and reactive to light.  Cardiovascular:     Rate and Rhythm: Normal rate.     Heart sounds: Normal heart sounds.  Pulmonary:     Effort: Pulmonary effort  is normal.  Musculoskeletal:        General: Normal range of motion.     Cervical back: Normal range of motion and neck supple.  Lymphadenopathy:     Cervical: No cervical adenopathy.  Skin:    General: Skin is warm and dry.     Findings: No rash.  Neurological:     General: No focal deficit present.     Mental Status: She is alert and oriented to person, place, and time.     GCS: GCS eye subscore is 4. GCS verbal subscore is 5. GCS motor subscore is 6.     Sensory: No sensory deficit.     Gait: Gait normal.     Comments:  Cranial nerves III-XII intact.  No pronator drift.  Psychiatric:        Speech: Speech normal.        Behavior:  Behavior normal.        Thought Content: Thought content normal.    ED Results / Procedures / Treatments   Labs (all labs ordered are listed, but only abnormal results are displayed) Labs Reviewed  CBC WITH DIFFERENTIAL/PLATELET - Abnormal; Notable for the following components:      Result Value   HCT 35.7 (*)    All other components within normal limits  BASIC METABOLIC PANEL - Abnormal; Notable for the following components:   Calcium 8.7 (*)    All other components within normal limits    EKG EKG Interpretation  Date/Time:  Friday April 22 2021 23:44:56 EST Ventricular Rate:  64 PR Interval:  134 QRS Duration: 82 QT Interval:  394 QTC Calculation: 407 R Axis:   53 Text Interpretation: Sinus rhythm Normal ECG No old tracing to compare Confirmed by Delora Fuel ([id number]) on 04/23/2021 12:01:23 AM  Radiology CT Head Wo Contrast  Result Date: 04/22/2021 CLINICAL DATA:  Head trauma, focal neuro findings (Age 14-64y) chronic dizziness, syncope last night with head injury. Syncope EXAM: CT HEAD WITHOUT CONTRAST TECHNIQUE: Contiguous axial images were obtained from the base of the skull through the vertex without intravenous contrast. COMPARISON:  None. FINDINGS: Brain: No acute intracranial abnormality. Specifically, no hemorrhage, hydrocephalus, mass lesion, acute infarction, or significant intracranial injury. Vascular: No hyperdense vessel or unexpected calcification. Skull: No acute calvarial abnormality. Sinuses/Orbits: No acute findings Other: None IMPRESSION: No acute intracranial abnormality. Electronically Signed   By: Rolm Baptise M.D.   On: 04/22/2021 23:30    Procedures Procedures    Medications Ordered in ED Medications - No data to display  ED Course/ Medical Decision Making/ A&P                           Medical Decision Making  This patient presents to the ED for chief complaint of syncope, this involves an extensive number of diagnostic options. The differential  diagnosis includes vasovagal syncope, dehydration, cardiac arrhythmia, pregnancy.  Co morbidities that complicate the patient evaluation  none  Additional history obtained:  Additional history obtained from sister at bedside provides some of hx External records from outside source obtained and reviewed including Urgent Care visit prior to arrival  Lab Tests:  I Ordered, and personally interpreted labs.  The pertinent results include:  normal hgb, no anemia, normal electrolytes, urine preg negative for Urgent care.  Imaging Studies ordered:  I ordered imaging studies including head CT  I independently visualized and interpreted imaging which showed no evidence of injury/subdural from trauma, no  evidence of intracranial pressure, mass. I agree with the radiologist interpretation  Cardiac Monitoring:  The patient was maintained on a cardiac monitor.  I personally viewed and interpreted the cardiac monitored which showed an underlying rhythm of: NSR  Medicines ordered and prescription drug management:  No medications indicated during this visit    Reevaluation:  After the interventions noted above, I reevaluated the patient and found that they have :stayed the same  Social Determinants of Health:  N/a  Disposition:  After consideration of the diagnostic results and the patients response to treatment, I feel that the most appropriate treatment course includes outpatient close f/u with her pcp.  Orthostatics obtained here and negative, pt is not clinically dehydrated, she is not pregnant, no ectopy noted during course of cardiac monitoring.  She was given minor head injury instructions.         Final Clinical Impression(s) / ED Diagnoses Final diagnoses:  Syncope, unspecified syncope type  Minor head injury, initial encounter    Rx / DC Orders ED Discharge Orders     None         Landis Martins 04/24/21 1056    Milton Ferguson, MD 04/24/21 1105

## 2021-04-23 NOTE — Discharge Instructions (Addendum)
Your lab tests, exam, CT and EKG are all reassuring today.  There is no obvious emergent reason for the reason for your syncopal event.  You may need further testing and evaluation, especially if your symptoms continue.  I recommend an office visit with your primary provider for recheck of your symptoms within the next week.  Make sure you are drinking plenty of fluids to avoid dehydration.

## 2021-04-25 ENCOUNTER — Telehealth: Payer: Self-pay

## 2021-04-25 NOTE — Telephone Encounter (Signed)
Transition Care Management Unsuccessful Follow-up Telephone Call  Date of discharge and from where:  04/22/2021-[place]   Attempts:  1st Attempt  Reason for unsuccessful TCM follow-up call:  Unable to reach patient

## 2021-04-26 NOTE — Telephone Encounter (Signed)
Transition Care Management Unsuccessful Follow-up Telephone Call  Date of discharge and from where:  04/22/2021-[place]   Attempts:  2nd Attempt  Reason for unsuccessful TCM follow-up call:  Unable to reach patient

## 2021-04-27 NOTE — Telephone Encounter (Signed)
Transition Care Management Unsuccessful Follow-up Telephone Call  Date of discharge and from where:  04/22/2021-[place]   Attempts:  3rd Attempt  Reason for unsuccessful TCM follow-up call:  Unable to reach patient

## 2021-05-06 DIAGNOSIS — R55 Syncope and collapse: Secondary | ICD-10-CM | POA: Diagnosis not present

## 2021-05-06 DIAGNOSIS — R42 Dizziness and giddiness: Secondary | ICD-10-CM | POA: Diagnosis not present

## 2021-05-06 DIAGNOSIS — I951 Orthostatic hypotension: Secondary | ICD-10-CM | POA: Diagnosis not present

## 2021-05-31 ENCOUNTER — Ambulatory Visit: Payer: Medicaid Other | Admitting: Cardiology

## 2021-06-02 ENCOUNTER — Ambulatory Visit: Payer: Medicaid Other | Admitting: Cardiology

## 2021-06-02 ENCOUNTER — Ambulatory Visit (INDEPENDENT_AMBULATORY_CARE_PROVIDER_SITE_OTHER): Payer: Medicaid Other

## 2021-06-02 ENCOUNTER — Other Ambulatory Visit: Payer: Self-pay

## 2021-06-02 VITALS — BP 118/72 | HR 78 | Ht 59.0 in | Wt 178.8 lb

## 2021-06-02 DIAGNOSIS — R55 Syncope and collapse: Secondary | ICD-10-CM | POA: Diagnosis not present

## 2021-06-02 NOTE — Patient Instructions (Signed)
Testing/Procedures:  Your physician has requested that you have an echocardiogram. Echocardiography is a painless test that uses sound waves to create images of your heart. It provides your doctor with information about the size and shape of your heart and how well your hearts chambers and valves are working. This procedure takes approximately one hour. There are no restrictions for this procedure. [place] Instructions  Your physician has requested you wear a ZIO patch monitor for 14 days.  This is a single patch monitor. [employer] supplies one patch monitor per enrollment. Additional stickers are not available. Please do not apply patch if you will be having a Nuclear Stress Test,  Echocardiogram, Cardiac CT, MRI, or Chest Xray during the period you would be wearing the  monitor. The patch cannot be worn during these tests. You cannot remove and re-apply the  ZIO XT patch monitor.  Your ZIO patch monitor will be mailed 3 day USPS to your address on file. It may take 3-5 days  to receive your monitor after you have been enrolled.  Once you have received your monitor, please review the enclosed instructions. Your monitor  has already been registered assigning a specific monitor serial # to you.  Billing and Patient Assistance Program Information  We have supplied [employer] with any of your insurance information on file for billing purposes. [employer] offers a sliding scale Patient Assistance Program for patients that do not have  insurance, or whose insurance does not completely cover the cost of the ZIO monitor.  You must apply for the Patient Assistance Program to qualify for this discounted rate.  To apply, please call [employer] at [phone], select option 4, select option 2, ask to apply for  Patient Assistance Program. Theodore Demark will ask your household income, and how many people  are in your household. They will quote your out-of-pocket cost  based on that information.  [employer] will also be able to set up a 10-month, interest-free payment plan if needed.  Applying the monitor   Shave hair from upper left chest.  Hold abrader disc by orange tab. Rub abrader in 40 strokes over the upper left chest as  indicated in your monitor instructions.  Clean area with 4 enclosed alcohol pads. Let dry.  Apply patch as indicated in monitor instructions. Patch will be placed under collarbone on left  side of chest with arrow pointing upward.  Rub patch adhesive wings for 2 minutes. Remove white label marked "1". Remove the white  label marked "2". Rub patch adhesive wings for 2 additional minutes.  While looking in a mirror, press and release button in center of patch. A small green light will  flash 3-4 times. This will be your only indicator that the monitor has been turned on.  Do not shower for the first 24 hours. You may shower after the first 24 hours.  Press the button if you feel a symptom. You will hear a small click. Record Date, Time and  Symptom in the Patient Logbook.  When you are ready to remove the patch, follow instructions on the last 2 pages of Patient  Logbook. Stick patch monitor onto the last page of Patient Logbook.  Place Patient Logbook in the blue and white box. Use locking tab on box and tape box closed  securely. The blue and white box has prepaid postage on it. Please place it in the mailbox as  soon as possible. Your physician should  have your test results approximately 7 days after the  monitor has been mailed back to [facility].  Call [place] at [phone] if you have questions regarding  your ZIO XT patch monitor. Call them immediately if you see an orange light blinking on your  monitor.  If your monitor falls off in less than 4 days, contact our Monitor department at [phone].  If your monitor becomes loose or falls off after 4 days call [employer] at [phone] for   suggestions on securing your monitor    Follow-Up: At [facility], you and your health needs are our priority.  As part of our continuing mission to provide you with exceptional heart care, we have created designated Provider Care Teams.  These Care Teams include your primary Cardiologist (physician) and Advanced Practice Providers (APPs -  Physician Assistants and Nurse Practitioners) who all work together to provide you with the care you need, when you need it.  We recommend signing up for the patient portal called "MyChart".  Sign up information is provided on this After Visit Summary.  MyChart is used to connect with patients for Virtual Visits (Telemedicine).  Patients are able to view lab/test results, encounter notes, upcoming appointments, etc.  Non-urgent messages can be sent to your provider as well.   To learn more about what you can do with MyChart, go to [email or web address].    Your next appointment:   6 month(s)  The format for your next appointment:   In Person  Provider:   Oswaldo Milian MD

## 2021-06-02 NOTE — Progress Notes (Signed)
Cardiology Office Note:    Date:  06/02/2021   ID:  [name], DOB 19-Feb-1991, MRN [id number]  PCP:  Pcp, No  Cardiologist:  None  Electrophysiologist:  None   Referring MD: Faustino Congress, NP   Chief Complaint  Patient presents with   Loss of Consciousness    History of Present Illness:    Lindsey Beard is a 31 y.o. female with no significant past medical history who is referred by Faustino Congress, NP for evaluation of dizziness.  She was seen in the ED on 04/22/2021 for dizziness.  Orthostatics were negative.  Labs unremarkable.  She reports has been having dizzy spells x4 months and has had 2 syncopal episodes.  Last syncopalepisode occurred about 1 month ago.  States that she felt very hot, started to have tunnel vision and lightheadedness.  She tried to stand up and walk outside to get air and then passed out.  Second episode occurred about 2 months ago.  She was walking to her daughter's room and suddenly felt dizzy and passed out.  No other symptoms with this.  She denies any chest pain, dyspnea, palpitations.  Reports occasional swelling in her feet.  Reports dizzy spells do not happen with position changes.  She used to vape occasionally, but quit about 1.5 years ago.  No smoking history.  No history of heart disease in her immediate family   Past Medical History:  Diagnosis Date   Hx of chlamydia infection    Hx of varicella    Medical history non-contributory    Pregnant     Past Surgical History:  Procedure Laterality Date   CESAREAN SECTION N/A 03/01/2014   Procedure: CESAREAN SECTION;  Surgeon: Maeola Sarah. Landry Mellow, MD;  Location: [facility];  Service: Obstetrics;  Laterality: N/A;   dilitation and curretage     NO PAST SURGERIES      Current Medications: No outpatient medications have been marked as taking for the 06/02/21 encounter (Office Visit) with Donato Heinz, MD.     Allergies:   Patient has no known allergies.   Social History    Socioeconomic History   Marital status: Single    Spouse name: Not on file   Number of children: Not on file   Years of education: Not on file   Highest education level: Not on file  Occupational History   Not on file  Tobacco Use   Smoking status: Never   Smokeless tobacco: Never  Vaping Use   Vaping Use: Never used  Substance and Sexual Activity   Alcohol use: Yes    Comment: occ   Drug use: Never   Sexual activity: Yes    Birth control/protection: None  Other Topics Concern   Not on file  Social History Narrative   Not on file   Social Determinants of Health   Financial Resource Strain: Not on file  Food Insecurity: Not on file  Transportation Needs: Not on file  Physical Activity: Not on file  Stress: Not on file  Social Connections: Not on file     Family History: The patient's family history includes Cancer in her maternal aunt; Diabetes in her maternal grandmother and mother; Gout in her maternal grandfather.  ROS:   Please see the history of present illness.     All other systems reviewed and are negative.  EKGs/Labs/Other Studies Reviewed:    The following studies were reviewed today:   EKG:  04/25/2021: Normal sinus rhythm, rate 64, no  ST abnormality  Recent Labs: 04/22/2021: BUN 11; Creatinine, Ser 0.70; Hemoglobin 12.0; Platelets 321; Potassium 3.9; Sodium 136  Recent Lipid Panel    Component Value Date/Time   CHOL 157 12/29/2015 1104   TRIG 80 12/29/2015 1104   HDL 38 (L) 12/29/2015 1104   CHOLHDL 4.1 12/29/2015 1104   VLDL 16 12/29/2015 1104   LDLCALC 103 12/29/2015 1104    Physical Exam:    VS:  BP 118/72    Pulse 78    Ht 4\' 11"  (1.499 m)    Wt 178 lb 12.8 oz (81.1 kg)    SpO2 99%    BMI 36.11 kg/m     Wt Readings from Last 3 Encounters:  06/02/21 178 lb 12.8 oz (81.1 kg)  04/22/21 180 lb (81.6 kg)  12/16/18 173 lb 8 oz (78.7 kg)     GEN:  Well nourished, well developed in no acute distress HEENT: Normal NECK: No JVD; No  carotid bruits LYMPHATICS: No lymphadenopathy CARDIAC: RRR, no murmurs, rubs, gallops RESPIRATORY:  Clear to auscultation without rales, wheezing or rhonchi  ABDOMEN: Soft, non-tender, non-distended MUSCULOSKELETAL:  No edema; No deformity  SKIN: Warm and dry NEUROLOGIC:  Alert and oriented x 3 PSYCHIATRIC:  Normal affect   ASSESSMENT:    1. Syncope and collapse    PLAN:    Syncope: Unclear cause.  Most recent episode sounds vasovagal, as describes prodromal symptoms prior to syncope.  However preceding episode appearred to happen suddenly without prodromal symptoms.  Will need to evaluate for arrhythmia.  Recommend Zio patch x2 weeks.  Check echocardiogram to rule out structural heart disease.  Orthostatics in clinic today showed no orthostatic hypotension, though heart rate did increase over 20 bpm from lying to standing.  Encouraged to stay well-hydrated, goal 3 L fluid per day.  RTC in 6 months  Medication Adjustments/Labs and Tests Ordered: Current medicines are reviewed at length with the patient today.  Concerns regarding medicines are outlined above.  Orders Placed This Encounter  Procedures   LONG TERM MONITOR (3-14 DAYS)   EKG 12-Lead   ECHOCARDIOGRAM COMPLETE   No orders of the defined types were placed in this encounter.   Patient Instructions   Testing/Procedures:  Your physician has requested that you have an echocardiogram. Echocardiography is a painless test that uses sound waves to create images of your heart. It provides your doctor with information about the size and shape of your heart and how well your hearts chambers and valves are working. This procedure takes approximately one hour. There are no restrictions for this procedure. [place] Instructions  Your physician has requested you wear a ZIO patch monitor for 14 days.  This is a single patch monitor. [employer] supplies one patch monitor per enrollment.  Additional stickers are not available. Please do not apply patch if you will be having a Nuclear Stress Test,  Echocardiogram, Cardiac CT, MRI, or Chest Xray during the period you would be wearing the  monitor. The patch cannot be worn during these tests. You cannot remove and re-apply the  ZIO XT patch monitor.  Your ZIO patch monitor will be mailed 3 day USPS to your address on file. It may take 3-5 days  to receive your monitor after you have been enrolled.  Once you have received your monitor, please review the enclosed instructions. Your monitor  has already been registered assigning a specific monitor serial # to you.  Billing and Patient Assistance Program Information  We have supplied [employer] with any of your insurance information on file for billing purposes. [employer] offers a sliding scale Patient Assistance Program for patients that do not have  insurance, or whose insurance does not completely cover the cost of the ZIO monitor.  You must apply for the Patient Assistance Program to qualify for this discounted rate.  To apply, please call [employer] at [phone], select option 4, select option 2, ask to apply for  Patient Assistance Program. Theodore Demark will ask your household income, and how many people  are in your household. They will quote your out-of-pocket cost based on that information.  [employer] will also be able to set up a 39-month, interest-free payment plan if needed.  Applying the monitor   Shave hair from upper left chest.  Hold abrader disc by orange tab. Rub abrader in 40 strokes over the upper left chest as  indicated in your monitor instructions.  Clean area with 4 enclosed alcohol pads. Let dry.  Apply patch as indicated in monitor instructions. Patch will be placed under collarbone on left  side of chest with arrow pointing upward.  Rub patch adhesive wings for 2 minutes. Remove white label marked "1". Remove the white  label marked "2". Rub patch adhesive wings  for 2 additional minutes.  While looking in a mirror, press and release button in center of patch. A small green light will  flash 3-4 times. This will be your only indicator that the monitor has been turned on.  Do not shower for the first 24 hours. You may shower after the first 24 hours.  Press the button if you feel a symptom. You will hear a small click. Record Date, Time and  Symptom in the Patient Logbook.  When you are ready to remove the patch, follow instructions on the last 2 pages of Patient  Logbook. Stick patch monitor onto the last page of Patient Logbook.  Place Patient Logbook in the blue and white box. Use locking tab on box and tape box closed  securely. The blue and white box has prepaid postage on it. Please place it in the mailbox as  soon as possible. Your physician should have your test results approximately 7 days after the  monitor has been mailed back to [facility].  Call [place] at [phone] if you have questions regarding  your ZIO XT patch monitor. Call them immediately if you see an orange light blinking on your  monitor.  If your monitor falls off in less than 4 days, contact our Monitor department at [phone].  If your monitor becomes loose or falls off after 4 days call [employer] at [phone] for  suggestions on securing your monitor    Follow-Up: At [facility], you and your health needs are our priority.  As part of our continuing mission to provide you with exceptional heart care, we have created designated Provider Care Teams.  These Care Teams include your primary Cardiologist (physician) and Advanced Practice Providers (APPs -  Physician Assistants and Nurse Practitioners) who all work together to provide you with the care you need, when you need it.  We recommend signing up for the patient portal called "MyChart".  Sign up information is provided on this After Visit Summary.  MyChart is used to connect with  patients for Virtual Visits (Telemedicine).  Patients are able to view lab/test results, encounter notes, upcoming appointments, etc.  Non-urgent messages can be sent to your  provider as well.   To learn more about what you can do with MyChart, go to [email or web address].    Your next appointment:   6 month(s)  The format for your next appointment:   In Person  Provider:   Oswaldo Milian MD      Signed, Donato Heinz, MD  06/02/2021 2:22 PM    [place]

## 2021-06-02 NOTE — Progress Notes (Unsigned)
Enrolled for [employer] to mail a ZIO XT long term holter monitor to the patients address on file.  

## 2021-06-07 DIAGNOSIS — R55 Syncope and collapse: Secondary | ICD-10-CM | POA: Diagnosis not present

## 2021-06-17 ENCOUNTER — Ambulatory Visit (HOSPITAL_COMMUNITY): Payer: Medicaid Other | Attending: Cardiology

## 2021-06-17 ENCOUNTER — Other Ambulatory Visit: Payer: Self-pay

## 2021-06-17 DIAGNOSIS — R55 Syncope and collapse: Secondary | ICD-10-CM | POA: Diagnosis not present

## 2021-06-17 LAB — ECHOCARDIOGRAM COMPLETE: S' Lateral: 2.5 cm

## 2021-06-23 ENCOUNTER — Encounter: Payer: Self-pay | Admitting: *Deleted

## 2021-07-15 ENCOUNTER — Encounter: Payer: Self-pay | Admitting: *Deleted

## 2021-07-26 ENCOUNTER — Encounter (HOSPITAL_BASED_OUTPATIENT_CLINIC_OR_DEPARTMENT_OTHER): Payer: Self-pay | Admitting: Nurse Practitioner

## 2021-07-26 ENCOUNTER — Ambulatory Visit (HOSPITAL_BASED_OUTPATIENT_CLINIC_OR_DEPARTMENT_OTHER): Payer: Medicaid Other | Admitting: Nurse Practitioner

## 2021-07-26 VITALS — BP 117/72 | HR 67 | Temp 98.7°F | Ht 59.0 in | Wt 183.0 lb

## 2021-07-26 DIAGNOSIS — R42 Dizziness and giddiness: Secondary | ICD-10-CM | POA: Diagnosis not present

## 2021-07-26 DIAGNOSIS — K219 Gastro-esophageal reflux disease without esophagitis: Secondary | ICD-10-CM

## 2021-07-26 DIAGNOSIS — R55 Syncope and collapse: Secondary | ICD-10-CM

## 2021-07-26 MED ORDER — PANTOPRAZOLE SODIUM 40 MG PO TBEC: 40.0000 mg | DELAYED_RELEASE_TABLET | Freq: Every day | ORAL | 3 refills | Status: DC

## 2021-07-26 NOTE — Patient Instructions (Addendum)
Thank you for choosing [place] [facility] for your Primary Care needs. I am excited for the opportunity to partner with you to meet your health care goals. It was a pleasure meeting you today! ? ?Recommendations from today's visit: ?I have sent the referral to GI for you for the belly pain you are experiencing. I have also sent in a medication called pantoprazole ?I have sent the referral to cardiology for you to see them - they will call you to schedule ?I am going to review your labs and tests and will call you to discuss what I find. I will be in touch with you soon.  ? ?Information on diet, exercise, and health maintenance recommendations are listed below. This is information to help you be sure you are on track for optimal health and monitoring.  ? ?Please look over this and let us know if you have any questions or if you have completed any of the health maintenance outside of August so that we can be sure your records are up to date.  ?___________________________________________________________ ?About Me: ?I am an Adult-Geriatric Nurse Practitioner with a background in caring for patients for more than 20 years with a strong intensive care background. I provide primary care and sports medicine services to patients age 9 and older within this office. My education had a strong focus on caring for the older adult population, which I am passionate about. I am also the director of the APP Fellowship with [facility].  ? ?My desire is to provide you with the best service through preventive medicine and supportive care. I consider you a part of the medical team and value your input. I work diligently to ensure that you are heard and your needs are met in a safe and effective manner. I want you to feel comfortable with me as your provider and want you to know that your health concerns are important to me. ? ?For your information, our office hours are: ?Monday, Tuesday, and Thursday  8:00 AM - 5:00 PM ?Wednesday and Friday 8:00 AM - 12:00 PM.  ? ?In my time away from the office I am teaching new APP's within the system and am unavailable, but my partner, Dr. Burnard Bunting is in the office for emergent needs.  ? ?If you have questions or concerns, please call our office at [phone] or send [place] a MyChart message and we will respond as quickly as possible.  ?____________________________________________________________ ?MyChart:  ?For all urgent or time sensitive needs we ask that you please call the office to avoid delays. Our number is [phone]. ?MyChart is not constantly monitored and due to the large volume of messages a day, replies may take up to 72 business hours. ? ?MyChart Policy: ?MyChart allows for you to see your visit notes, after visit summary, provider recommendations, lab and tests results, make an appointment, request refills, and contact your provider or the office for non-urgent questions or concerns. Providers are seeing patients during normal business hours and do not have built in time to review MyChart messages.  ?We ask that you allow a minimum of 3 business days for responses to [employer]. For this reason, please do not send urgent requests through [place]. Please call the office at [phone]. ?New and ongoing conditions may require a visit. We have virtual and in person visit available for your convenience.  ?Complex MyChart concerns may require a visit. Your provider may request you schedule a virtual or in person visit  to ensure we are providing the best care possible. ?MyChart messages sent after 11:00 AM on Friday will not be received by the provider until Monday morning.  ?  ?Lab and Test Results: ?You will receive your lab and test results on MyChart as soon as they are completed and results have been sent by the lab or testing facility. Due to this service, you will receive your results BEFORE your provider.  ?I review lab and tests results each morning  prior to seeing patients. Some results require collaboration with other providers to ensure you are receiving the most appropriate care. For this reason, we ask that you please allow a minimum of 3-5 business days from the time the ALL results have been received for your provider to receive and review lab and test results and contact you about these.  ?Most lab and test result comments from the provider will be sent through [place]. Your provider may recommend changes to the plan of care, follow-up visits, repeat testing, ask questions, or request an office visit to discuss these results. You may reply directly to this message or call the office at [phone] to provide information for the provider or set up an appointment. ?In some instances, you will be called with test results and recommendations. Please let us know if this is preferred and we will make note of this in your chart to provide this for you.    ?If you have not heard a response to your lab or test results in 5 business days from all results returning to [place], please call the office to let us know. We ask that you please avoid calling prior to this time unless there is an emergent concern. Due to high call volumes, this can delay the resulting process. ? ?After Hours: ?For all non-emergency after hours needs, please call the office at [phone] and select the option to reach the on-call provider service. On-call services are shared between multiple [place] offices and therefore it will not be possible to speak directly with your provider. On-call providers may provide medical advice and recommendations, but are unable to provide refills for maintenance medications.  ?For all emergency or urgent medical needs after normal business hours, we recommend that you seek care at the closest Urgent Care or Emergency Department to ensure appropriate treatment in a timely manner.  ?[facility] has a 24 hour emergency room  located on the ground floor for your convenience.  ? ?Urgent Concerns During the Business Day ?Providers are seeing patients from 8AM to [place] with a busy schedule and are most often not able to respond to non-urgent calls until the end of the day or the next business day. ?If you should have URGENT concerns during the day, please call and speak to the nurse or schedule a same day appointment so that we can address your concern without delay.  ? ?Thank you, again, for choosing me as your health care partner. I appreciate your trust and look forward to learning more about you.  ? ?Worthy Keeler, DNP, AGNP-c ?___________________________________________________________ ? ?Health Maintenance Recommendations ?Screening Testing ?Mammogram ?Every 1 -2 years based on history and risk factors ?Starting at age 65 ?Pap Smear ?Ages 21-39 every 3 years ?Ages 57-65 every 5 years with HPV testing ?More frequent testing may be required based on results and history ?Colon Cancer Screening ?Every 1-10 years based on test performed, risk factors, and history ?Starting at age 59 ?Bone Density Screening ?Every 2-10 years based on  history ?Starting at age 60 for women ?Recommendations for men differ based on medication usage, history, and risk factors ?AAA Screening ?One time ultrasound ?Men 37-10 years old who have every smoked ?Lung Cancer Screening ?Low Dose Lung CT every 12 months ?Age 6-80 years with a 30 pack-year smoking history who still smoke or who have quit within the last 15 years ? ?Screening Labs ?Routine  Labs: Complete Blood Count (CBC), Complete Metabolic Panel (CMP), Cholesterol (Lipid Panel) ?Every 6-12 months based on history and medications ?May be recommended more frequently based on current conditions or previous results ?Hemoglobin A1c Lab ?Every 3-12 months based on history and previous results ?Starting at age 68 or earlier with diagnosis of diabetes, high cholesterol, BMI >26, and/or risk factors ?Frequent  monitoring for patients with diabetes to ensure blood sugar control ?Thyroid Panel (TSH w/ T3 & T4) ?Every 6 months based on history, symptoms, and risk factors ?May be repeated more often if on medication ?HIV ?

## 2021-07-29 NOTE — Progress Notes (Signed)
?Orma Render, DNP, AGNP-c ?Primary Care & Sports Medicine ?[place], [place] [address] ?[phone] ? ?New patient visit ? ? ?Patient: Lindsey Beard   DOB: [birth year]   31 y.o. Female  MRN: [id number] ?Visit Date: 07/26/2021 ? ?Patient Care Team: ?Brealyn Baril, Coralee Pesa, NP as PCP - General (Nurse Practitioner) ?Donato Heinz, MD as PCP - Cardiology (Cardiology) ? ?Today's healthcare provider: Orma Render, NP  ? ?Chief Complaint  ?Patient presents with  ? Establish Care  ? ?Subjective  ?  ?Lindsey Beard is a 31 y.o. female who presents today as a new patient to establish care.  ?  ?Patient endorses the following concerns presently: ?Abdominal Pain ?Endorses sharp generalized upper abdominal pain, gas, bloating ?Has always had "problems with stomach" ?Occasional sharp pain in the side that shoots upward towards the chest cavity- lasts 10-15 seconds ?No correlation with BM, food, hunger. No changes in bowel habits, constipation, diarrhea, blood in stools, nausea, vomiting.  ?Reflux and burning in the center of chest present on occasion ? ?Dizziness/Syncope ?Endorses hx of dizziness and syncope ?Has had ECHO and Xio patch in the past with no findings ?No dx ?Reports that dizziness may be correlated with anxiety, but not sure ?No CP, palpitations, ShOB, HA, vision changes ?Syncopal episodes not long standing and not frequent ?Would like referral to cardiology ? ?History reviewed and reveals the following: ?Past Medical History:  ?Diagnosis Date  ? Hx of chlamydia infection   ? Hx of varicella   ? Medical history non-contributory   ? Pregnant   ? ?Past Surgical History:  ?Procedure Laterality Date  ? CESAREAN SECTION N/A 03/01/2014  ? Procedure: CESAREAN SECTION;  Surgeon: Maeola Sarah. Landry Mellow, MD;  Location: [facility];  Service: Obstetrics;  Laterality: N/A;  ? dilitation and curretage    ? NO PAST SURGERIES    ? ?Family Status  ?Relation Name Status  ? Mother  Alive  ?  Father  Deceased  ? Brother  Alive  ? MGM  Deceased  ? MGF  Alive  ? PGM  Alive  ? PGF  Deceased  ? Daughter  Alive  ? Mat Aunt  Deceased  ? ?Family History  ?Problem Relation Age of Onset  ? Diabetes Mother   ? Diabetes Maternal Grandmother   ? Gout Maternal Grandfather   ? Cancer Maternal Aunt   ? ?Social History  ? ?Socioeconomic History  ? Marital status: Single  ?  Spouse name: Not on file  ? Number of children: Not on file  ? Years of education: Not on file  ? Highest education level: Not on file  ?Occupational History  ? Not on file  ?Tobacco Use  ? Smoking status: Never  ? Smokeless tobacco: Never  ?Vaping Use  ? Vaping Use: Never used  ?Substance and Sexual Activity  ? Alcohol use: Yes  ?  Comment: occ  ? Drug use: Never  ? Sexual activity: Yes  ?  Birth control/protection: None  ?Other Topics Concern  ? Not on file  ?Social History Narrative  ? Not on file  ? ?Social Determinants of Health  ? ?Financial Resource Strain: Not on file  ?Food Insecurity: Not on file  ?Transportation Needs: Not on file  ?Physical Activity: Not on file  ?Stress: Not on file  ?Social Connections: Not on file  ? ?No outpatient medications prior to visit.  ? ?No facility-administered medications prior to visit.  ? ?No Known  Allergies ?Immunization History  ?Administered Date(s) Administered  ? Tdap 12/09/2011  ? ? ?Review of Systems ?All review of systems negative except what is listed in the HPI ? ? Objective  ?  ?BP 117/72   Pulse 67   Temp 98.7 ?F (37.1 ?C)   Ht '4\' 11"'$  (1.499 m)   Wt 183 lb (83 kg)   SpO2 97%   BMI 36.96 kg/m?  ?Physical Exam ?Vitals and nursing note reviewed.  ?Constitutional:   ?   General: She is not in acute distress. ?   Appearance: Normal appearance.  ?Eyes:  ?   Extraocular Movements: Extraocular movements intact.  ?   Conjunctiva/sclera: Conjunctivae normal.  ?   Pupils: Pupils are equal, round, and reactive to light.  ?Neck:  ?   Vascular: No carotid bruit.  ?Cardiovascular:  ?   Rate and Rhythm:  Normal rate and regular rhythm.  ?   Pulses: Normal pulses.  ?   Heart sounds: Normal heart sounds. No murmur heard. ?Pulmonary:  ?   Effort: Pulmonary effort is normal.  ?   Breath sounds: Normal breath sounds. No wheezing or rhonchi.  ?Chest:  ?   Chest wall: No tenderness.  ?Abdominal:  ?   General: Bowel sounds are normal. There is no distension.  ?   Palpations: Abdomen is soft.  ?   Tenderness: There is abdominal tenderness in the epigastric area. There is no right CVA tenderness, left CVA tenderness or guarding.  ?Musculoskeletal:     ?   General: Normal range of motion.  ?   Cervical back: Normal range of motion.  ?   Right lower leg: No edema.  ?   Left lower leg: No edema.  ?Skin: ?   General: Skin is warm and dry.  ?   Capillary Refill: Capillary refill takes less than 2 seconds.  ?Neurological:  ?   General: No focal deficit present.  ?   Mental Status: She is alert and oriented to person, place, and time.  ?Psychiatric:     ?   Mood and Affect: Mood normal.     ?   Behavior: Behavior normal.     ?   Thought Content: Thought content normal.     ?   Judgment: Judgment normal.  ? ? ?No results found for any visits on 07/26/21. ? Assessment & Plan   ?  ? ?Problem List Items Addressed This Visit   ? ? Gastroesophageal reflux disease  ?  Suspect abdominal sx related to GERD given presentation and epigastric tenderness present. Recommend trial of pantoprazole for treatment and monitor for changes in symptoms. No alarm sx present today.Will send referral to GI for evaluation and further recommendations. Patient will f/u if new or worsening sx present.  ? ?  ?  ? Relevant Medications  ? pantoprazole (PROTONIX) 40 MG tablet  ? Other Relevant Orders  ? Ambulatory referral to Gastroenterology  ? Dizziness - Primary  ?  Hx of dizziness with syncopal episodes of unknown etiology.  ?Cardiology work-up in the past unrevealing of cause ?BP is stable today. No evidence of vestibular dysfunction. Neurologically intact. No  cardiac alarm sx present.  ?Will send referral to cardiology for further evaluation.  ?Question possible dysautonomia could be present.  ? ?  ?  ? Relevant Orders  ? Ambulatory referral to Cardiology  ? Syncope  ?  See dizziness ? ?  ?  ? Relevant Orders  ? Ambulatory referral to Cardiology  ? ? ? ?  Return for TBD based on chart review.  ?  ? ? ?Keyonna Comunale, Coralee Pesa, NP, DNP, AGNP-C ?Primary Care & Sports Medicine at [facility] ?[facility]  ? ?

## 2021-07-29 NOTE — Assessment & Plan Note (Signed)
Hx of dizziness with syncopal episodes of unknown etiology.  ?Cardiology work-up in the past unrevealing of cause ?BP is stable today. No evidence of vestibular dysfunction. Neurologically intact. No cardiac alarm sx present.  ?Will send referral to cardiology for further evaluation.  ?Question possible dysautonomia could be present.  ?

## 2021-07-29 NOTE — Assessment & Plan Note (Addendum)
Suspect abdominal sx related to GERD given presentation and epigastric tenderness present. Recommend trial of pantoprazole for treatment and monitor for changes in symptoms. No alarm sx present today.Will send referral to GI for evaluation and further recommendations. Patient will f/u if new or worsening sx present.  ?

## 2021-07-29 NOTE — Assessment & Plan Note (Signed)
See dizziness ?

## 2021-08-26 ENCOUNTER — Encounter: Payer: Self-pay | Admitting: Gastroenterology

## 2021-09-01 ENCOUNTER — Ambulatory Visit: Payer: Medicaid Other | Admitting: Gastroenterology

## 2022-03-19 ENCOUNTER — Ambulatory Visit
Admission: EM | Admit: 2022-03-19 | Discharge: 2022-03-19 | Disposition: A | Discharge status: Home / Self Care | Payer: Medicaid Other | Attending: Family Medicine | Admitting: Family Medicine

## 2022-03-19 DIAGNOSIS — R490 Dysphonia: Secondary | ICD-10-CM | POA: Diagnosis not present

## 2022-03-19 DIAGNOSIS — L03317 Cellulitis of buttock: Secondary | ICD-10-CM

## 2022-03-19 MED ORDER — CEPHALEXIN 500 MG PO CAPS: 500.0000 mg | ORAL_CAPSULE | Freq: Two times a day (BID) | ORAL | 0 refills | Status: DC

## 2022-03-19 MED ORDER — CETIRIZINE HCL 10 MG PO TABS: 10.0000 mg | ORAL_TABLET | Freq: Every day | ORAL | 0 refills | Status: AC

## 2022-03-19 MED ORDER — TRIAMCINOLONE ACETONIDE 0.1 % EX CREA: 1.0000 | TOPICAL_CREAM | Freq: Two times a day (BID) | CUTANEOUS | 0 refills | Status: AC

## 2022-03-19 NOTE — Discharge Instructions (Addendum)
For the area on your buttocks, we will cover with antibiotics and antihistamine as well as a topical steroid cream.  You may apply compresses, take warm Epsom salt baths and take ibuprofen as needed for pain and inflammation.  Regarding your hoarseness, the antihistamines may help and you may do salt water gargles, warm honey tea, throat lozenges, nasal sprays

## 2022-03-19 NOTE — ED Triage Notes (Signed)
Pt reports she has an abscess like lump on her left buttocks that formed Thursday night. The area is warm and tender to the touch. Pt cannot sit down.  Pt voices is hoarse but no throat pain. She wants to know what she can do for it.

## 2022-03-19 NOTE — ED Provider Notes (Signed)
[facility]    CSN: [id number] Arrival date & time: 03/19/22  6226      History   Chief Complaint No chief complaint on file.   HPI Lindsey Beard is a 31 y.o. female.   Presenting today with a painful red swollen warm area to the left buttocks that started 3 days ago.  Denies drainage, bleeding, fevers, chills, known injury to the area.  So far tried amoxicillin last night that a family member had leftover with minimal relief.  She is also having hoarseness that started this morning without throat pain, congestion, fever, chills, cough.  Wanting to know what she can do to help with it.    Past Medical History:  Diagnosis Date   Hx of chlamydia infection    Hx of varicella    Medical history non-contributory    Pregnant     Patient Active Problem List   Diagnosis Date Noted   Gastroesophageal reflux disease 07/26/2021   Dizziness 07/26/2021   Syncope 07/26/2021   Obesity 12/29/2015   Routine general medical examination at a health care facility 12/29/2015   Short stature 02/16/2014    Past Surgical History:  Procedure Laterality Date   CESAREAN SECTION N/A 03/01/2014   Procedure: CESAREAN SECTION;  Surgeon: Maeola Sarah. Landry Mellow, MD;  Location: [facility];  Service: Obstetrics;  Laterality: N/A;   dilitation and curretage     NO PAST SURGERIES      OB History     Gravida  2   Para  1   Term  1   Preterm      AB  1   Living  1      SAB  1   IAB      Ectopic      Multiple  0   Live Births  1            Home Medications    Prior to Admission medications   Medication Sig Start Date End Date Taking? Authorizing Provider  cephALEXin (KEFLEX) 500 MG capsule Take 1 capsule (500 mg total) by mouth 2 (two) times daily. 03/19/22  Yes Volney American, PA-C  cetirizine (ZYRTEC ALLERGY) 10 MG tablet Take 1 tablet (10 mg total) by mouth daily. 03/19/22  Yes Volney American, PA-C  triamcinolone cream (KENALOG) 0.1 % Apply 1 Application  topically 2 (two) times daily. 03/19/22  Yes Volney American, PA-C  pantoprazole (PROTONIX) 40 MG tablet Take 1 tablet (40 mg total) by mouth daily. 07/26/21   Early, Coralee Pesa, NP    Family History Family History  Problem Relation Age of Onset   Diabetes Mother    Diabetes Maternal Grandmother    Gout Maternal Grandfather    Cancer Maternal Aunt     Social History Social History   Tobacco Use   Smoking status: Never   Smokeless tobacco: Never  Vaping Use   Vaping Use: Never used  Substance Use Topics   Alcohol use: Yes    Comment: occ   Drug use: Never     Allergies   Patient has no known allergies.   Review of Systems Review of Systems PER HPI  Physical Exam Triage Vital Signs ED Triage Vitals  Enc Vitals Group     BP 03/19/22 0827 108/78     Pulse Rate 03/19/22 0827 96     Resp 03/19/22 0827 20     Temp 03/19/22 0827 98.1 F (36.7 C)     Temp  Source 03/19/22 0827 Oral     SpO2 03/19/22 0827 99 %     Weight --      Height --      Head Circumference --      Peak Flow --      Pain Score 03/19/22 0833 5     Pain Loc --      Pain Edu? --      Excl. in [place]? --    No data found.  Updated Vital Signs BP 108/78 (BP Location: Right Arm)   Pulse 96   Temp 98.1 F (36.7 C) (Oral)   Resp 20   LMP 02/15/2022 (Approximate)   SpO2 99%   Visual Acuity Right Eye Distance:   Left Eye Distance:   Bilateral Distance:    Right Eye Near:   Left Eye Near:    Bilateral Near:     Physical Exam Vitals and nursing note reviewed. Exam conducted with a chaperone present.  Constitutional:      Appearance: Normal appearance. She is not ill-appearing.  HENT:     Head: Atraumatic.     Nose: Nose normal.     Mouth/Throat:     Mouth: Mucous membranes are moist.     Pharynx: Posterior oropharyngeal erythema present. No oropharyngeal exudate.  Eyes:     Extraocular Movements: Extraocular movements intact.     Conjunctiva/sclera: Conjunctivae normal.   Cardiovascular:     Rate and Rhythm: Normal rate and regular rhythm.     Heart sounds: Normal heart sounds.  Pulmonary:     Effort: Pulmonary effort is normal.     Breath sounds: Normal breath sounds.  Musculoskeletal:        General: Normal range of motion.     Cervical back: Normal range of motion and neck supple.  Lymphadenopathy:     Cervical: No cervical adenopathy.  Skin:    General: Skin is warm and dry.     Comments: 3 cm erythematous and mildly edematous area with central scabbing present to left buttock, no fluctuance or induration, tenderness to palpation  Neurological:     Mental Status: She is alert and oriented to person, place, and time.  Psychiatric:        Mood and Affect: Mood normal.        Thought Content: Thought content normal.        Judgment: Judgment normal.      UC Treatments / Results  Labs (all labs ordered are listed, but only abnormal results are displayed) Labs Reviewed - No data to display  EKG   Radiology No results found.  Procedures Procedures (including critical care time)  Medications Ordered in UC Medications - No data to display  Initial Impression / Assessment and Plan / UC Course  I have reviewed the triage vital signs and the nursing notes.  Pertinent labs & imaging results that were available during my care of the patient were reviewed by me and considered in my medical decision making (see chart for details).     Cover for cellulitis of the buttock, though possibly allergic site reaction from possibly an insect bite.  Triamcinolone, antihistamines, Keflex to cover for both possibilities.  Discussed compresses, Epsom salt soaks, ibuprofen as needed.  Regarding her sore throat, possibly allergic versus new viral cause.  Discussed nasal sprays, antihistamines, salt water gargles, warm honey tea.  Return for worsening symptoms.  Final Clinical Impressions(s) / UC Diagnoses   Final diagnoses:  Cellulitis of buttock  Hoarseness of voice     Discharge Instructions      For the area on your buttocks, we will cover with antibiotics and antihistamine as well as a topical steroid cream.  You may apply compresses, take warm Epsom salt baths and take ibuprofen as needed for pain and inflammation.  Regarding your hoarseness, the antihistamines may help and you may do salt water gargles, warm honey tea, throat lozenges, nasal sprays    ED Prescriptions     Medication Sig Dispense Auth. Provider   cephALEXin (KEFLEX) 500 MG capsule Take 1 capsule (500 mg total) by mouth 2 (two) times daily. 14 capsule Volney American, [place]   cetirizine (ZYRTEC ALLERGY) 10 MG tablet Take 1 tablet (10 mg total) by mouth daily. 30 tablet Volney American, [place]   triamcinolone cream (KENALOG) 0.1 % Apply 1 Application topically 2 (two) times daily. 60 g Volney American, [place]      PDMP not reviewed this encounter.   Volney American, [place] 03/19/22 [phone]

## 2022-03-20 ENCOUNTER — Telehealth: Payer: Self-pay

## 2022-03-20 NOTE — Telephone Encounter (Signed)
Patient called about increased pain to buttocks area where possibly infection that she was seen for yesterday. Spoke with patient about her medications she was taking and advised her to continue her antibiotics and to start taking ibuprofen every 6 hours to help alleviate her pain. Spoke with the provider here Maricela Bo. and she stated that it does take up to two days for the antibiotics to start working and to keep taking them and the ibuprofen to help with pain, and to come back in to be seen if after the 48 hours on antibiotics her infection is spreading or getting worse. Patient verbalized understanding.

## 2022-04-19 NOTE — Patient Instructions (Incomplete)
It was a pleasure seeing you today! Thank you for trusting me with your care.   If labs were collected today, you will see the results as soon as they become available in [place]. I will review these labs once I have received all of the results and send you comments and recommendations, if any. If you have specific concerns, we can set up an appointment (virtual or in person) to go over details and come up with a plan together.   If you have received any referrals today, the office where the referral was made will be in contact with you to set up your appointment.   If you have received orders for imaging today, the imaging office will contact you to schedule this. Please note that some imaging requires a prior authorization from insurance, so an order is not a guarantee this will be covered by your insurance, but I want to assure you we will do our best to get this covered and if it is not, you will be notified and we can come up with an alternative plan.   If you take regular prescription medications, please contact your pharmacy for routine refill requests. They will send this directly to [place].  If you were ordered new medication as a part of your examination and treatment today, please contact your pharmacy to determine the status. Many prescriptions require a prior authorization and the pharmacy will contact us to get this started. This may take a few days for approval or denial. If the medication is denied, we will work with you to try alternative medications.   If you have any questions or concerns, please do not hesitate to contact the office via telephone or [place].  MyChart messages are received by the [facility] staff during regular business hours Monday through Friday and we do our best to respond in a timely manner.  If your request requires an appointment, the staff will gladly help set that up so that we have the time dedicated to ensure that your questions are appropriately answered.

## 2022-04-19 NOTE — Progress Notes (Signed)
Worthy Keeler, DNP, AGNP-c [place] [place], [place] [address] [place] [phone]  BP 126/82   Pulse 66   Temp 98.3 F (36.8 C)   Wt 177 lb 3.2 oz (80.4 kg)   LMP 03/24/2022   BMI 35.79 kg/m    Subjective:    Patient ID: Lindsey Beard, female    DOB: 12/11/1990, 32 y.o.   MRN: [id number]  HPI: Lindsey Beard is a 32 y.o. female presenting on 04/20/2022 for comprehensive medical examination. She is here with her daughter Lindsey Beard  IMMUNIZATIONS:   Flu: Flu vaccine declined, patient does not wish to complete Prevnar 13: Prevnar 13 N/A for this patient Prevnar 20: Prevnar 20 N/A for this patient Pneumovax 23: Pneumovax 23 N/A for this patient Vac Shingrix: Shingrix N/A for this patient HPV: HPV N/A for this patient Tetanus: Tetanus completed in the last 10 years COVID: Declined  HEALTH MAINTENANCE: Pap Smear HM Status: is due and to be scheduled by patient for later completion Mammogram HM Status: is not applicable for this patient Colon Cancer Screening HM Status: is not applicable for this patient Bone Density HM Status: is not applicable for this patient STI Testing HM Status: is due and to be scheduled by patient for later completion  She reports regular vision exams q1-5y: No  She reports regular dental exams q 86m  Yes  The patient eats a regular, healthy diet. She endorses exercise and/or activity of:  routine activity  Current medical concerns include: Pain in the pelvic area for about a month. Radiates to the back. She tells me the pain will come up intermittently for just a second and is sharp and electric like. No correlation with menstruation, BM, or other activities. No changes in BM.  Epigastric abdominal pain after eating and sometimes not hungry.   Pertinent items are noted in HPI.  Most Recent Depression Screen:     04/20/2022    8:56 AM 07/26/2021    3:26 PM 12/02/2018   10:18 AM 02/28/2017    4:19 PM   Depression screen PHQ 2/9  Decreased Interest 0 0 0 0  Down, Depressed, Hopeless 0 0 0 0  PHQ - 2 Score 0 0 0 0   Most Recent Anxiety Screen:      No data to display         Most Recent Fall Screen:    04/20/2022    8:56 AM 07/26/2021    3:25 PM 12/02/2018   10:18 AM 02/28/2017    4:19 PM  FGilain the past year? 0 1 0 No  Number falls in past yr: 0 1    Injury with Fall? 0 0    Risk for fall due to : No Fall Risks No Fall Risks    Follow up Falls evaluation completed Falls evaluation completed;Education provided      Past medical history, surgical history, medications, allergies, family history and social history reviewed with patient today and changes made to appropriate areas of the chart.  Past Medical History:  Past Medical History:  Diagnosis Date   Hx of chlamydia infection    Hx of varicella    Medical history non-contributory    Pregnant    Medications:  Current Outpatient Medications on File Prior to Visit  Medication Sig   cephALEXin (KEFLEX) 500 MG capsule Take 1 capsule (500 mg total) by mouth 2 (two) times daily. (Patient not taking: Reported on 04/20/2022)  cetirizine (ZYRTEC ALLERGY) 10 MG tablet Take 1 tablet (10 mg total) by mouth daily. (Patient not taking: Reported on 04/20/2022)   triamcinolone cream (KENALOG) 0.1 % Apply 1 Application topically 2 (two) times daily. (Patient not taking: Reported on 04/20/2022)   No current facility-administered medications on file prior to visit.   Surgical History:  Past Surgical History:  Procedure Laterality Date   CESAREAN SECTION N/A 03/01/2014   Procedure: CESAREAN SECTION;  Surgeon: Maeola Sarah. Landry Mellow, MD;  Location: [facility];  Service: Obstetrics;  Laterality: N/A;   dilitation and curretage     NO PAST SURGERIES     Allergies:  No Known Allergies Family History:  Family History  Problem Relation Age of Onset   Diabetes Mother    Diabetes Maternal Grandmother    Gout Maternal Grandfather    Cancer  Maternal Aunt        Objective:    BP 126/82   Pulse 66   Temp 98.3 F (36.8 C)   Wt 177 lb 3.2 oz (80.4 kg)   LMP 03/24/2022   BMI 35.79 kg/m   Wt Readings from Last 3 Encounters:  04/20/22 177 lb 3.2 oz (80.4 kg)  07/26/21 183 lb (83 kg)  06/02/21 178 lb 12.8 oz (81.1 kg)    Physical Exam Vitals and nursing note reviewed.  Constitutional:      General: She is not in acute distress.    Appearance: Normal appearance.  HENT:     Head: Normocephalic and atraumatic.     Right Ear: Hearing, tympanic membrane, ear canal and external ear normal.     Left Ear: Hearing, tympanic membrane, ear canal and external ear normal.     Nose: Nose normal.     Right Sinus: No maxillary sinus tenderness or frontal sinus tenderness.     Left Sinus: No maxillary sinus tenderness or frontal sinus tenderness.     Mouth/Throat:     Lips: Pink.     Mouth: Mucous membranes are moist.     Pharynx: Oropharynx is clear.  Eyes:     General: Lids are normal. Vision grossly intact.     Extraocular Movements: Extraocular movements intact.     Conjunctiva/sclera: Conjunctivae normal.     Pupils: Pupils are equal, round, and reactive to light.     Funduscopic exam:    Right eye: Red reflex present.        Left eye: Red reflex present.    Visual Fields: Right eye visual fields normal and left eye visual fields normal.  Neck:     Thyroid: No thyromegaly.     Vascular: No carotid bruit.  Cardiovascular:     Rate and Rhythm: Normal rate and regular rhythm.     Chest Wall: PMI is not displaced.     Pulses: Normal pulses.          Dorsalis pedis pulses are 2+ on the right side and 2+ on the left side.       Posterior tibial pulses are 2+ on the right side and 2+ on the left side.     Heart sounds: Normal heart sounds. No murmur heard. Pulmonary:     Effort: Pulmonary effort is normal. No respiratory distress.     Breath sounds: Normal breath sounds.  Abdominal:     General: Abdomen is flat. Bowel  sounds are normal. There is no distension.     Palpations: Abdomen is soft. There is no hepatomegaly, splenomegaly or mass.  Tenderness: There is abdominal tenderness in the epigastric area. There is no right CVA tenderness, left CVA tenderness, guarding or rebound.    Musculoskeletal:        General: Normal range of motion.     Cervical back: Full passive range of motion without pain, normal range of motion and neck supple. No tenderness.     Right lower leg: No edema.     Left lower leg: No edema.  Feet:     Left foot:     Toenail Condition: Left toenails are normal.  Lymphadenopathy:     Cervical: No cervical adenopathy.     Upper Body:     Right upper body: No supraclavicular adenopathy.     Left upper body: No supraclavicular adenopathy.  Skin:    General: Skin is warm and dry.     Capillary Refill: Capillary refill takes less than 2 seconds.     Nails: There is no clubbing.  Neurological:     General: No focal deficit present.     Mental Status: She is alert and oriented to person, place, and time.     GCS: GCS eye subscore is 4. GCS verbal subscore is 5. GCS motor subscore is 6.     Sensory: Sensation is intact.     Motor: Motor function is intact.     Coordination: Coordination is intact.     Gait: Gait is intact.     Deep Tendon Reflexes: Reflexes are normal and symmetric.  Psychiatric:        Attention and Perception: Attention normal.        Mood and Affect: Mood normal.        Speech: Speech normal.        Behavior: Behavior normal. Behavior is cooperative.        Thought Content: Thought content normal.        Cognition and Memory: Cognition and memory normal.        Judgment: Judgment normal.     Results for orders placed or performed in visit on 04/20/22  GC/Chlamydia Probe Amp   UR  Result Value Ref Range   Chlamydia trachomatis, NAA Negative Negative   Neisseria Gonorrhoeae by PCR Negative Negative  CBC with Differential/Platelet  Result Value Ref  Range   WBC 5.4 3.4 - 10.8 x10E3/uL   RBC 4.10 3.77 - 5.28 x10E6/uL   Hemoglobin 11.7 11.1 - 15.9 g/dL   Hematocrit 35.4 34.0 - 46.6 %   MCV 86 79 - 97 fL   MCH 28.5 26.6 - 33.0 pg   MCHC 33.1 31.5 - 35.7 g/dL   RDW 11.8 11.7 - 15.4 %   Platelets 371 150 - 450 x10E3/uL   Neutrophils 52 Not Estab. %   Lymphs 37 Not Estab. %   Monocytes 8 Not Estab. %   Eos 2 Not Estab. %   Basos 1 Not Estab. %   Neutrophils Absolute 2.8 1.4 - 7.0 x10E3/uL   Lymphocytes Absolute 2.0 0.7 - 3.1 x10E3/uL   Monocytes Absolute 0.4 0.1 - 0.9 x10E3/uL   EOS (ABSOLUTE) 0.1 0.0 - 0.4 x10E3/uL   Basophils Absolute 0.1 0.0 - 0.2 x10E3/uL   Immature Granulocytes 0 Not Estab. %   Immature Grans (Abs) 0.0 0.0 - 0.1 x10E3/uL  Comprehensive metabolic panel  Result Value Ref Range   Glucose 89 70 - 99 mg/dL   BUN 7 6 - 20 mg/dL   Creatinine, Ser 0.69 0.57 - 1.00 mg/dL   eGFR 120 >  59 mL/min/1.73   BUN/Creatinine Ratio 10 9 - 23   Sodium 139 134 - 144 mmol/L   Potassium 4.3 3.5 - 5.2 mmol/L   Chloride 101 96 - 106 mmol/L   CO2 24 20 - 29 mmol/L   Calcium 9.3 8.7 - 10.2 mg/dL   Total Protein 7.1 6.0 - 8.5 g/dL   Albumin 4.3 4.0 - 5.0 g/dL   Globulin, Total 2.8 1.5 - 4.5 g/dL   Albumin/Globulin Ratio 1.5 1.2 - 2.2   Bilirubin Total 0.3 0.0 - 1.2 mg/dL   Alkaline Phosphatase 79 44 - 121 IU/L   AST 18 0 - 40 IU/L   ALT 24 0 - 32 IU/L  Hemoglobin A1c  Result Value Ref Range   Hgb A1c MFr Bld 5.0 4.8 - 5.6 %   Est. average glucose Bld gHb Est-mCnc 97 mg/dL  Lipid panel  Result Value Ref Range   Cholesterol, Total 185 100 - 199 mg/dL   Triglycerides 42 0 - 149 mg/dL   HDL 50 >39 mg/dL   VLDL Cholesterol Cal 8 5 - 40 mg/dL   LDL Chol Calc (NIH) 127 (H) 0 - 99 mg/dL   Chol/HDL Ratio 3.7 0.0 - 4.4 ratio  RPR  Result Value Ref Range   RPR Ser Ql Non Reactive Non Reactive  Hepatitis C antibody  Result Value Ref Range   Hep C Virus Ab Non Reactive Non Reactive  HIV Antibody (routine testing w rflx)  Result  Value Ref Range   HIV Screen 4th Generation wRfx Non Reactive Non Reactive  T4, free  Result Value Ref Range   Free T4 1.13 0.82 - 1.77 ng/dL  TSH  Result Value Ref Range   TSH 1.720 0.450 - 4.500 uIU/mL         Assessment & Plan:   Problem List Items Addressed This Visit     Routine general medical examination at a health care facility - Primary    CPE today with no abnormalities noted on exam.  Labs pending. Will make changes as necessary based on results.  Review of HM activities and recommendations discussed and provided on AVS Anticipatory guidance, diet, and exercise recommendations provided.  Medications, allergies, and hx reviewed and updated as necessary.  Plan to f/u with CPE in 1 year or sooner for acute/chronic health needs as directed.        Relevant Orders   CBC with Differential/Platelet (Completed)   Comprehensive metabolic panel (Completed)   Hemoglobin A1c (Completed)   Lipid panel (Completed)   Gastroesophageal reflux disease    Epigastric tenderness consistent with GERD. Given her current symptoms, we will trial pantoprazole for 30 days to see if this is helpful in symptom relief. Labs pending today. No alarm sx present at this time. Will make changes to plan of care if no improvement with medication.       Relevant Medications   pantoprazole (PROTONIX) 40 MG tablet   Left lower quadrant abdominal pain    Intermittent sharp pain noted by patient with no clear etiology. Will test today for STI. If symptoms are negative and pain continues, recommend pelvic and transvaginal US for further evaluation. Patient is due for pap, therefore we will also plan for this in the near future at her convenience. No alarm symptoms present at this time.       Relevant Orders   POCT URINALYSIS DIP (CLINITEK)   Weight gain    Concerns with weight gain discussed. Diet and exercise recommendations provided  for patient. We will obtain labs today for evaluation. Will consider  medication for management if unable to achieve goal with diet and exercise alone and labs do not indicate need for alternative treatment options.       Relevant Orders   T4, free (Completed)   TSH (Completed)   Other Visit Diagnoses     Health care maintenance       Relevant Orders   CBC with Differential/Platelet (Completed)   Comprehensive metabolic panel (Completed)   Hemoglobin A1c (Completed)   Lipid panel (Completed)   T4, free (Completed)   TSH (Completed)   Routine screening for STI (sexually transmitted infection)       Relevant Orders   GC/Chlamydia Probe Amp (Completed)   RPR (Completed)   Hepatitis C antibody (Completed)   HIV Antibody (routine testing w rflx) (Completed)          Follow up plan: Return in about 1 year (around 04/21/2023) for CPE with Pap.  NEXT PREVENTATIVE PHYSICAL DUE IN 1 YEAR.  PATIENT COUNSELING PROVIDED FOR ALL ADULT PATIENTS:  Consume a well balanced diet low in saturated fats, cholesterol, and moderation in carbohydrates.   This can be as simple as monitoring portion sizes and cutting back on sugary beverages such as soda and juice to start with.    Daily water consumption of at least 64 ounces.  Physical activity at least 180 minutes per week, if just starting out.   This can be as simple as taking the stairs instead of the elevator and walking 2-3 laps around the office  purposefully every day.   STD protection, partner selection, and regular testing if high risk.  Limited consumption of alcoholic beverages if alcohol is consumed.  For women, I recommend no more than 7 alcoholic beverages per week, spread out throughout the week.  Avoid "binge" drinking or consuming large quantities of alcohol in one setting.   Please let me know if you feel you may need help with reduction or quitting alcohol consumption.   Avoidance of nicotine, if used.  Please let me know if you feel you may need help with reduction or quitting nicotine  use.   Daily mental health attention.  This can be in the form of 5 minute daily meditation, prayer, journaling, yoga, reflection, etc.   Purposeful attention to your emotions and mental state can significantly improve your overall wellbeing and Health.  Please know that I am here to help you with all of your health care goals and am happy to work with you to find a solution that works best for you.  The greatest advice I have received with any changes in life are to take it one step at a time, that even means if all you can focus on is the next 60 seconds, then do that and celebrate your victories.  With any changes in life, you will have set backs, and that is OK. The important thing to remember is, if you have a set back, it is not a failure, it is an opportunity to try again!  Health Maintenance Recommendations Screening Testing Mammogram Every 1 -2 years based on history and risk factors Starting at age 59 Pap Smear Ages 21-39 every 3 years Ages 61-65 every 5 years with HPV testing More frequent testing may be required based on results and history Colon Cancer Screening Every 1-10 years based on test performed, risk factors, and history Starting at age 94 Bone Density Screening Every 2-10 years based on  history Starting at age 35 for women Recommendations for men differ based on medication usage, history, and risk factors AAA Screening One time ultrasound Men 47-31 years old who have every smoked Lung Cancer Screening Low Dose Lung CT every 12 months Age 1-80 years with a 30 pack-year smoking history who still smoke or who have quit within the last 15 years  Screening Labs Routine  Labs: Complete Blood Count (CBC), Complete Metabolic Panel (CMP), Cholesterol (Lipid Panel) Every 6-12 months based on history and medications May be recommended more frequently based on current conditions or previous results Hemoglobin A1c Lab Every 3-12 months based on history and previous  results Starting at age 5 or earlier with diagnosis of diabetes, high cholesterol, BMI >26, and/or risk factors Frequent monitoring for patients with diabetes to ensure blood sugar control Thyroid Panel (TSH w/ T3 & T4) Every 6 months based on history, symptoms, and risk factors May be repeated more often if on medication HIV One time testing for all patients 90 and older May be repeated more frequently for patients with increased risk factors or exposure Hepatitis C One time testing for all patients 22 and older May be repeated more frequently for patients with increased risk factors or exposure Gonorrhea, Chlamydia Every 12 months for all sexually active persons 13-24 years Additional monitoring may be recommended for those who are considered high risk or who have symptoms PSA Men 42-76 years old with risk factors Additional screening may be recommended from age 748-69 based on risk factors, symptoms, and history  Vaccine Recommendations Tetanus Booster All adults every 10 years Flu Vaccine All patients 6 months and older every year COVID Vaccine All patients 12 years and older Initial dosing with booster May recommend additional booster based on age and health history HPV Vaccine 2 doses all patients age 74-26 Dosing may be considered for patients over 26 Shingles Vaccine (Shingrix) 2 doses all adults 18 years and older Pneumonia (Pneumovax 23) All adults 47 years and older May recommend earlier dosing based on health history Pneumonia (Prevnar 30) All adults 64 years and older Dosed 1 year after Pneumovax 23  Additional Screening, Testing, and Vaccinations may be recommended on an individualized basis based on family history, health history, risk factors, and/or exposure.

## 2022-04-20 ENCOUNTER — Ambulatory Visit: Payer: Medicaid Other | Admitting: Nurse Practitioner

## 2022-04-20 ENCOUNTER — Encounter: Payer: Self-pay | Admitting: Nurse Practitioner

## 2022-04-20 VITALS — BP 126/82 | HR 66 | Temp 98.3°F | Wt 177.2 lb

## 2022-04-20 DIAGNOSIS — Z0000 Encounter for general adult medical examination without abnormal findings: Secondary | ICD-10-CM | POA: Diagnosis not present

## 2022-04-20 DIAGNOSIS — Z113 Encounter for screening for infections with a predominantly sexual mode of transmission: Secondary | ICD-10-CM | POA: Diagnosis not present

## 2022-04-20 DIAGNOSIS — K219 Gastro-esophageal reflux disease without esophagitis: Secondary | ICD-10-CM

## 2022-04-20 DIAGNOSIS — R1032 Left lower quadrant pain: Secondary | ICD-10-CM | POA: Diagnosis not present

## 2022-04-20 DIAGNOSIS — R635 Abnormal weight gain: Secondary | ICD-10-CM | POA: Diagnosis not present

## 2022-04-20 LAB — CBC WITH DIFFERENTIAL/PLATELET
Basophils Absolute: 0.1 10*3/uL (ref 0.0–0.2)
Lymphocytes Absolute: 2 10*3/uL (ref 0.7–3.1)
MCV: 86 fL (ref 79–97)

## 2022-04-20 MED ORDER — PANTOPRAZOLE SODIUM 40 MG PO TBEC: 40.0000 mg | DELAYED_RELEASE_TABLET | Freq: Every day | ORAL | 0 refills | Status: AC

## 2022-04-21 LAB — COMPREHENSIVE METABOLIC PANEL
ALT: 24 IU/L (ref 0–32)
AST: 18 IU/L (ref 0–40)
Albumin/Globulin Ratio: 1.5 (ref 1.2–2.2)
Albumin: 4.3 g/dL (ref 4.0–5.0)
Alkaline Phosphatase: 79 IU/L (ref 44–121)
BUN/Creatinine Ratio: 10 (ref 9–23)
BUN: 7 mg/dL (ref 6–20)
Bilirubin Total: 0.3 mg/dL (ref 0.0–1.2)
CO2: 24 mmol/L (ref 20–29)
Calcium: 9.3 mg/dL (ref 8.7–10.2)
Chloride: 101 mmol/L (ref 96–106)
Creatinine, Ser: 0.69 mg/dL (ref 0.57–1.00)
Globulin, Total: 2.8 g/dL (ref 1.5–4.5)
Glucose: 89 mg/dL (ref 70–99)
Potassium: 4.3 mmol/L (ref 3.5–5.2)
Sodium: 139 mmol/L (ref 134–144)
Total Protein: 7.1 g/dL (ref 6.0–8.5)
eGFR: 120 mL/min/{1.73_m2} (ref >59)

## 2022-04-21 LAB — GC/CHLAMYDIA PROBE AMP
Chlamydia trachomatis, NAA: NEGATIVE
Neisseria Gonorrhoeae by PCR: NEGATIVE

## 2022-04-21 LAB — LIPID PANEL
Chol/HDL Ratio: 3.7 ratio (ref 0.0–4.4)
Cholesterol, Total: 185 mg/dL (ref 100–199)
HDL: 50 mg/dL (ref >39)
LDL Chol Calc (NIH): 127 mg/dL — ABNORMAL HIGH (ref 0–99)
Triglycerides: 42 mg/dL (ref 0–149)
VLDL Cholesterol Cal: 8 mg/dL (ref 5–40)

## 2022-04-21 LAB — HIV ANTIBODY (ROUTINE TESTING W REFLEX): HIV Screen 4th Generation wRfx: NONREACTIVE

## 2022-04-21 LAB — CBC WITH DIFFERENTIAL/PLATELET
Basos: 1 %
EOS (ABSOLUTE): 0.1 10*3/uL (ref 0.0–0.4)
Eos: 2 %
Hematocrit: 35.4 % (ref 34.0–46.6)
Hemoglobin: 11.7 g/dL (ref 11.1–15.9)
Immature Grans (Abs): 0 10*3/uL (ref 0.0–0.1)
Immature Granulocytes: 0 %
Lymphs: 37 %
MCH: 28.5 pg (ref 26.6–33.0)
MCHC: 33.1 g/dL (ref 31.5–35.7)
Monocytes Absolute: 0.4 10*3/uL (ref 0.1–0.9)
Monocytes: 8 %
Neutrophils Absolute: 2.8 10*3/uL (ref 1.4–7.0)
Neutrophils: 52 %
Platelets: 371 10*3/uL (ref 150–450)
RBC: 4.1 x10E6/uL (ref 3.77–5.28)
RDW: 11.8 % (ref 11.7–15.4)
WBC: 5.4 10*3/uL (ref 3.4–10.8)

## 2022-04-21 LAB — HEMOGLOBIN A1C
Est. average glucose Bld gHb Est-mCnc: 97 mg/dL
Hgb A1c MFr Bld: 5 % (ref 4.8–5.6)

## 2022-04-21 LAB — T4, FREE: Free T4: 1.13 ng/dL (ref 0.82–1.77)

## 2022-04-21 LAB — TSH: TSH: 1.72 u[IU]/mL (ref 0.450–4.500)

## 2022-04-21 LAB — RPR: RPR Ser Ql: NONREACTIVE

## 2022-04-21 LAB — HEPATITIS C ANTIBODY: Hep C Virus Ab: NONREACTIVE

## 2022-04-23 DIAGNOSIS — R635 Abnormal weight gain: Secondary | ICD-10-CM | POA: Insufficient documentation

## 2022-04-23 DIAGNOSIS — R1032 Left lower quadrant pain: Secondary | ICD-10-CM | POA: Insufficient documentation

## 2022-04-23 NOTE — Assessment & Plan Note (Signed)
Epigastric tenderness consistent with GERD. Given her current symptoms, we will trial pantoprazole for 30 days to see if this is helpful in symptom relief. Labs pending today. No alarm sx present at this time. Will make changes to plan of care if no improvement with medication.

## 2022-04-23 NOTE — Assessment & Plan Note (Signed)
Intermittent sharp pain noted by patient with no clear etiology. Will test today for STI. If symptoms are negative and pain continues, recommend pelvic and transvaginal US for further evaluation. Patient is due for pap, therefore we will also plan for this in the near future at her convenience. No alarm symptoms present at this time.

## 2022-04-23 NOTE — Assessment & Plan Note (Signed)

## 2022-04-23 NOTE — Assessment & Plan Note (Signed)
Concerns with weight gain discussed. Diet and exercise recommendations provided for patient. We will obtain labs today for evaluation. Will consider medication for management if unable to achieve goal with diet and exercise alone and labs do not indicate need for alternative treatment options.

## 2022-04-24 ENCOUNTER — Encounter: Payer: Self-pay | Admitting: Nurse Practitioner

## 2022-04-24 ENCOUNTER — Other Ambulatory Visit: Payer: Self-pay

## 2022-04-24 DIAGNOSIS — R102 Pelvic and perineal pain: Secondary | ICD-10-CM

## 2022-04-25 ENCOUNTER — Ambulatory Visit
Admission: RE | Admit: 2022-04-25 | Discharge: 2022-04-25 | Disposition: A | Discharge status: Home / Self Care | Payer: Medicaid Other | Source: Ambulatory Visit | Attending: Nurse Practitioner | Admitting: Nurse Practitioner

## 2022-04-25 DIAGNOSIS — R1032 Left lower quadrant pain: Secondary | ICD-10-CM | POA: Diagnosis not present

## 2022-04-25 DIAGNOSIS — R102 Pelvic and perineal pain: Secondary | ICD-10-CM

## 2022-04-28 ENCOUNTER — Other Ambulatory Visit: Payer: Self-pay

## 2022-04-28 DIAGNOSIS — R1032 Left lower quadrant pain: Secondary | ICD-10-CM

## 2022-05-16 MED ORDER — PHENTERMINE HCL 37.5 MG PO TABS: 37.5000 mg | ORAL_TABLET | Freq: Every day | ORAL | 2 refills | Status: AC

## 2022-05-16 NOTE — Addendum Note (Signed)
Addended by: Saanvi Hakala, Clarise Cruz E on: 05/16/2022 07:05 AM   Modules accepted: Orders

## 2022-06-09 ENCOUNTER — Encounter: Payer: Self-pay | Admitting: Physician Assistant

## 2022-06-24 ENCOUNTER — Emergency Department (HOSPITAL_COMMUNITY)
Admission: EM | Admit: 2022-06-24 | Discharge: 2022-06-24 | Disposition: A | Discharge status: Home / Self Care | Payer: Commercial Managed Care - HMO | Attending: Emergency Medicine | Admitting: Emergency Medicine

## 2022-06-24 ENCOUNTER — Other Ambulatory Visit: Payer: Self-pay

## 2022-06-24 ENCOUNTER — Emergency Department (HOSPITAL_COMMUNITY): Payer: Commercial Managed Care - HMO

## 2022-06-24 ENCOUNTER — Encounter (HOSPITAL_COMMUNITY): Payer: Self-pay

## 2022-06-24 DIAGNOSIS — S46911A Strain of unspecified muscle, fascia and tendon at shoulder and upper arm level, right arm, initial encounter: Secondary | ICD-10-CM | POA: Diagnosis not present

## 2022-06-24 DIAGNOSIS — T310 Burns involving less than 10% of body surface: Secondary | ICD-10-CM | POA: Insufficient documentation

## 2022-06-24 DIAGNOSIS — Y92410 Unspecified street and highway as the place of occurrence of the external cause: Secondary | ICD-10-CM | POA: Insufficient documentation

## 2022-06-24 DIAGNOSIS — T2122XA Burn of second degree of abdominal wall, initial encounter: Secondary | ICD-10-CM | POA: Diagnosis not present

## 2022-06-24 DIAGNOSIS — M25521 Pain in right elbow: Secondary | ICD-10-CM | POA: Diagnosis present

## 2022-06-24 DIAGNOSIS — S29019A Strain of muscle and tendon of unspecified wall of thorax, initial encounter: Secondary | ICD-10-CM | POA: Insufficient documentation

## 2022-06-24 DIAGNOSIS — T22211A Burn of second degree of right forearm, initial encounter: Secondary | ICD-10-CM | POA: Diagnosis not present

## 2022-06-24 LAB — CBC WITH DIFFERENTIAL/PLATELET
Abs Immature Granulocytes: 0.02 10*3/uL (ref 0.00–0.07)
Basophils Absolute: 0.1 10*3/uL (ref 0.0–0.1)
Basophils Relative: 1 %
Eosinophils Absolute: 0 10*3/uL (ref 0.0–0.5)
Eosinophils Relative: 1 %
HCT: 36.7 % (ref 36.0–46.0)
Hemoglobin: 12.2 g/dL (ref 12.0–15.0)
Immature Granulocytes: 0 %
Lymphocytes Relative: 27 %
Lymphs Abs: 1.8 10*3/uL (ref 0.7–4.0)
MCH: 29.6 pg (ref 26.0–34.0)
MCHC: 33.2 g/dL (ref 30.0–36.0)
MCV: 89.1 fL (ref 80.0–100.0)
Monocytes Absolute: 0.4 10*3/uL (ref 0.1–1.0)
Monocytes Relative: 6 %
Neutro Abs: 4.3 10*3/uL (ref 1.7–7.7)
Neutrophils Relative %: 65 %
Platelets: 439 10*3/uL — ABNORMAL HIGH (ref 150–400)
RBC: 4.12 MIL/uL (ref 3.87–5.11)
RDW: 12.4 % (ref 11.5–15.5)
WBC: 6.7 10*3/uL (ref 4.0–10.5)
nRBC: 0 % (ref 0.0–0.2)

## 2022-06-24 LAB — HCG, QUANTITATIVE, PREGNANCY: hCG, Beta Chain, Quant, S: <1 m[IU]/mL (ref <5)

## 2022-06-24 MED ORDER — IOHEXOL 300 MG/ML  SOLN
100.0000 mL | Freq: Once | INTRAMUSCULAR | Status: AC | PRN
  Administered 2022-06-24: 100 mL via INTRAVENOUS

## 2022-06-24 MED ORDER — BACITRACIN ZINC 500 UNIT/GM EX OINT
TOPICAL_OINTMENT | Freq: Two times a day (BID) | CUTANEOUS | Status: DC
  Filled 2022-06-24: qty 1.8

## 2022-06-24 MED ORDER — METHOCARBAMOL 500 MG PO TABS: 500.0000 mg | ORAL_TABLET | Freq: Two times a day (BID) | ORAL | 0 refills | Status: AC | PRN

## 2022-06-24 MED ORDER — NAPROXEN 500 MG PO TABS: 500.0000 mg | ORAL_TABLET | Freq: Two times a day (BID) | ORAL | 0 refills | Status: AC

## 2022-06-24 MED ORDER — BACITRACIN ZINC 500 UNIT/GM EX OINT: TOPICAL_OINTMENT | Freq: Two times a day (BID) | CUTANEOUS | Status: DC
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
## 2022-06-24 NOTE — ED Notes (Signed)
Patient transported to CT 

## 2022-06-24 NOTE — Discharge Instructions (Signed)
Please apply the topical antibiotic ointment called bacitracin which you can get from your local pharmacy over-the-counter.  Keep this covered with a sterile dressing anywhere that you have a burn or blister.  This will gradually heal over the next 2 weeks, you may clean with gentle soap and water, pat it dry and then apply the topical antibiotic ointment.  You will likely have some pain in your back and neck and arms and legs over the next few days.  Thankfully the x-rays do not show any signs of broken bones either of your nose your elbow or any other part of your body.  The CAT scan of your chest and abdomen showed no internal injuries.  Please take Naprosyn, '500mg'$  by mouth twice daily as needed for pain - this in an antiinflammatory medicine (NSAID) and is similar to ibuprofen - many people feel that it is stronger than ibuprofen and it is easier to take since it is a smaller pill.  Please use this only for 1 week - if your pain persists, you will need to follow up with your doctor in the office for ongoing guidance and pain control.  Please take Robaxin, 500 mg up to 2 or 3 times a day as needed for muscle spasm, this is a muscle relaxer, it may cause generalized weakness, sleepiness and you should not drive or do important things while taking this medication.  This includes driving a vehicle or taking care of young children, these things should not be done while taking this medication.   Thank you for allowing [place] to treat you in the emergency department today.  After reviewing your examination and potential testing that was done it appears that you are safe to go home.  I would like for you to follow-up with your doctor within the next several days, have them obtain your results and follow-up with them to review all of these tests.  If you should develop severe or worsening symptoms return to the emergency department immediately

## 2022-06-24 NOTE — ED Notes (Signed)
ED Provider at bedside. 
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
## 2022-07-13 ENCOUNTER — Ambulatory Visit: Payer: Medicaid Other | Admitting: Nurse Practitioner

## 2022-07-17 ENCOUNTER — Ambulatory Visit: Payer: Medicaid Other | Admitting: Physician Assistant

## 2022-07-24 ENCOUNTER — Encounter: Payer: Self-pay | Admitting: Nurse Practitioner

## 2022-08-21 ENCOUNTER — Ambulatory Visit (INDEPENDENT_AMBULATORY_CARE_PROVIDER_SITE_OTHER): Payer: Commercial Managed Care - HMO

## 2022-08-21 ENCOUNTER — Emergency Department (HOSPITAL_COMMUNITY)
Admission: EM | Admit: 2022-08-21 | Discharge: 2022-08-21 | Discharge status: Against Medical Advice | Payer: Commercial Managed Care - HMO

## 2022-08-21 ENCOUNTER — Ambulatory Visit
Admission: RE | Admit: 2022-08-21 | Discharge: 2022-08-21 | Disposition: A | Discharge status: Home / Self Care | Payer: Commercial Managed Care - HMO | Source: Ambulatory Visit | Attending: Nurse Practitioner | Admitting: Nurse Practitioner

## 2022-08-21 VITALS — BP 110/60 | Temp 97.9°F | Resp 20

## 2022-08-21 DIAGNOSIS — S63502A Unspecified sprain of left wrist, initial encounter: Secondary | ICD-10-CM | POA: Diagnosis not present

## 2022-08-21 NOTE — Discharge Instructions (Signed)
The x-ray is negative for fracture or dislocation.  Symptoms appear to be consistent with a sprain/strain of the left wrist. Continue over-the-counter Tylenol or ibuprofen as needed for pain or discomfort. RICE therapy, rest, ice, compression, and elevation.  Apply ice for 20 minutes, remove for 1 hour, then repeat as needed. Gentle range of motion exercises of the left wrist to help decrease recovery time. As discussed, symptoms should improve over the next 1 to 2 weeks, if you are not experiencing any improvement of symptoms, or symptoms appear to be worsening, please follow-up with orthopedics for further evaluation.  You can follow-up with [facility] of [place] at [phone] with [facility] at [phone]. Follow-up as needed.
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
## 2022-08-21 NOTE — ED Provider Notes (Signed)
[facility]    CSN: [id number] Arrival date & time: 08/21/22  1220      History   Chief Complaint No chief complaint on file.   HPI Lindsey Beard is a 32 y.o. female.   The history is provided by the patient.   Patient presents for complaints of left wrist pain that started after she fell off a skateboard several days ago.  Patient states over the last several days, she has noticed increased pain in the left wrist.  Pain is located on the top of the left wrist.  Patient complains of pain with movement.  She denies numbness, tingling, radiation of pain, or prior injury.  Patient reports she has been using ice and taking over-the-counter ibuprofen for pain.  She denies any previous injury or trauma.  Patient reports she is right-hand dominant.  Past Medical History:  Diagnosis Date   Hx of chlamydia infection    Hx of varicella    Medical history non-contributory    Pregnant     Patient Active Problem List   Diagnosis Date Noted   Left lower quadrant abdominal pain 04/23/2022   Weight gain 04/23/2022   Gastroesophageal reflux disease 07/26/2021   Dizziness 07/26/2021   Syncope 07/26/2021   Routine general medical examination at a health care facility 12/29/2015   Short stature 02/16/2014    Past Surgical History:  Procedure Laterality Date   CESAREAN SECTION N/A 03/01/2014   Procedure: CESAREAN SECTION;  Surgeon: Dorien Chihuahua. Richardson Dopp, MD;  Location: [facility];  Service: Obstetrics;  Laterality: N/A;   dilitation and curretage     NO PAST SURGERIES      OB History     Gravida  2   Para  1   Term  1   Preterm      AB  1   Living  1      SAB  1   IAB      Ectopic      Multiple  0   Live Births  1            Home Medications    Prior to Admission medications   Medication Sig Start Date End Date Taking? Authorizing Provider  phentermine (ADIPEX-P) 37.5 MG tablet Take 1 tablet (37.5 mg total) by mouth daily before breakfast. 05/16/22    Early, Sung Amabile, NP  cephALEXin (KEFLEX) 500 MG capsule Take 1 capsule (500 mg total) by mouth 2 (two) times daily. Patient not taking: Reported on 04/20/2022 03/19/22   Particia Nearing, PA-C  cetirizine (ZYRTEC ALLERGY) 10 MG tablet Take 1 tablet (10 mg total) by mouth daily. Patient not taking: Reported on 04/20/2022 03/19/22   Particia Nearing, PA-C  methocarbamol (ROBAXIN) 500 MG tablet Take 1 tablet (500 mg total) by mouth 2 (two) times daily as needed for muscle spasms. 06/24/22   Eber Hong, MD  naproxen (NAPROSYN) 500 MG tablet Take 1 tablet (500 mg total) by mouth 2 (two) times daily with a meal. 06/24/22   Eber Hong, MD  pantoprazole (PROTONIX) 40 MG tablet Take 1 tablet (40 mg total) by mouth daily. For stomach pain. 04/20/22   Tollie Eth, NP  triamcinolone cream (KENALOG) 0.1 % Apply 1 Application topically 2 (two) times daily. Patient not taking: Reported on 04/20/2022 03/19/22   Particia Nearing, PA-C    Family History Family History  Problem Relation Age of Onset   Diabetes Mother    Diabetes Maternal Grandmother  Gout Maternal Grandfather    Cancer Maternal Aunt     Social History Social History   Tobacco Use   Smoking status: Never   Smokeless tobacco: Never  Vaping Use   Vaping Use: Never used  Substance Use Topics   Alcohol use: Yes    Comment: occ   Drug use: Never     Allergies   Patient has no known allergies.   Review of Systems Review of Systems Per HPI  Physical Exam Triage Vital Signs ED Triage Vitals [08/21/22 1248]  Enc Vitals Group     BP 110/60     Pulse      Resp 20     Temp 97.9 F (36.6 C)     Temp Source Oral     SpO2 99 %     Weight      Height      Head Circumference      Peak Flow      Pain Score 5     Pain Loc      Pain Edu?      Excl. in GC?    No data found.  Updated Vital Signs BP 110/60 (BP Location: Right Arm)   Temp 97.9 F (36.6 C) (Oral)   Resp 20   LMP 08/02/2022   SpO2 99%   Visual  Acuity Right Eye Distance:   Left Eye Distance:   Bilateral Distance:    Right Eye Near:   Left Eye Near:    Bilateral Near:     Physical Exam Vitals and nursing note reviewed.  Constitutional:      General: She is not in acute distress.    Appearance: Normal appearance.  Eyes:     Extraocular Movements: Extraocular movements intact.     Pupils: Pupils are equal, round, and reactive to light.  Pulmonary:     Effort: Pulmonary effort is normal.  Musculoskeletal:     Left wrist: Tenderness present. No swelling, deformity or crepitus. Decreased range of motion. Normal pulse.     Comments: Tenderness noted to the dorsal aspect of the left wrist.  There is no obvious ecchymosis, deformity, or erythema present.  Skin:    General: Skin is warm and dry.  Neurological:     General: No focal deficit present.     Mental Status: She is alert.  Psychiatric:        Mood and Affect: Mood normal.        Behavior: Behavior normal.      UC Treatments / Results  Labs (all labs ordered are listed, but only abnormal results are displayed) Labs Reviewed - No data to display  EKG   Radiology DG Wrist Complete Left  Result Date: 08/21/2022 CLINICAL DATA:  Patient fell 3 days ago left wrist. Generalized pain. EXAM: LEFT WRIST - COMPLETE 3+ VIEW COMPARISON:  Left hand radiographs 06/24/2022 FINDINGS: There is no evidence of fracture or dislocation. There is no evidence of arthropathy or other focal bone abnormality. Soft tissues are unremarkable. IMPRESSION: Negative. Electronically Signed   By: Emmaline Kluver M.D.   On: 08/21/2022 13:04    Procedures Procedures (including critical care time)  Medications Ordered in UC Medications - No data to display  Initial Impression / Assessment and Plan / UC Course  I have reviewed the triage vital signs and the nursing notes.  Pertinent labs & imaging results that were available during my care of the patient were reviewed by me and considered  in my  medical decision making (see chart for details).  The patient is well-appearing, she is in no acute distress, vital signs are stable.  X-ray is negative for fracture or dislocation.  Symptoms appear to be consistent with a wrist sprain/strain.  Patient was provided an Ace wrap to provide compression and support.  Patient advised to continue RICE therapy.  Report care recommendations were provided and discussed with the patient to include gentle range of motion of the left wrist, and continuing over-the-counter analgesics for pain or discomfort.  Patient was advised of when follow-up may be necessary.  Patient is in agreement with this plan of care and verbalizes understanding.  Patient was given information for [facility] and for [facility] if follow-up as needed.  Patient is stable for discharge.   Final Clinical Impressions(s) / UC Diagnoses   Final diagnoses:  None   Discharge Instructions   None    ED Prescriptions   None    PDMP not reviewed this encounter.   Abran Cantor, NP 08/21/22 1331

## 2022-09-01 IMAGING — CT CT HEAD W/O CM
3 series · 15 of 47 positions shown, 18 images · non-contrast
Comparison: None.

CLINICAL DATA: Head trauma, focal neuro findings (Age 18-64y)
chronic dizziness, syncope last night with head injury. Syncope

EXAM:
CT HEAD WITHOUT CONTRAST
TECHNIQUE: Contiguous axial images were obtained from the base of the skull
through the vertex without intravenous contrast.

[Series 2: head w o · axial · 0.45mm/px · z∈[-43,+82]mm · 9 of 31 slices shown, 12 images]
[im 3/31  brain]
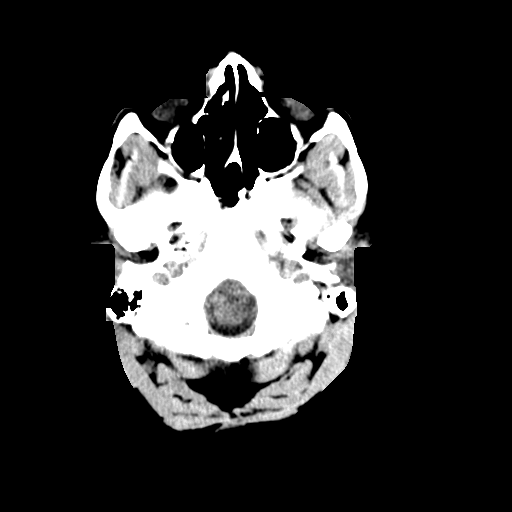
[im 3/31  bone]
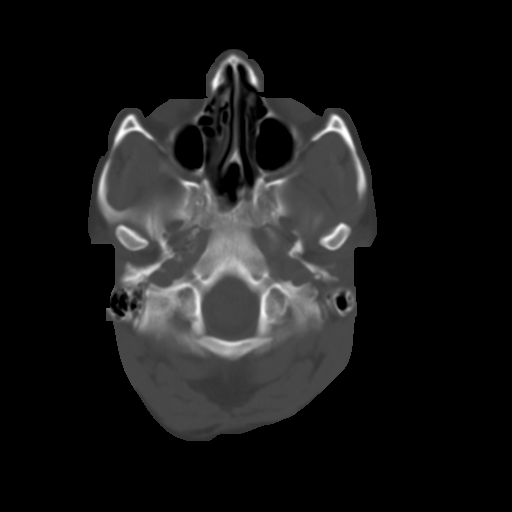
[im 6/31  brain]
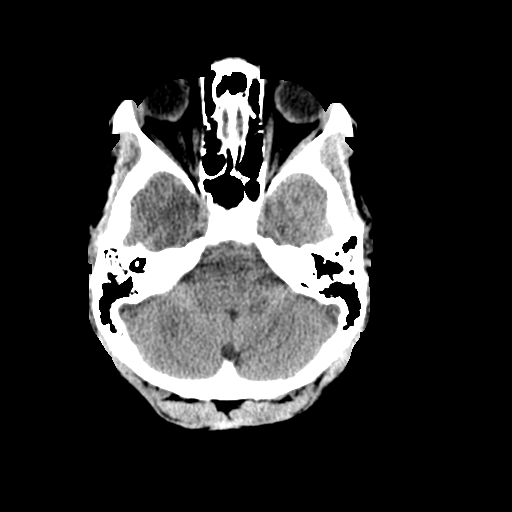
[im 9/31  brain]
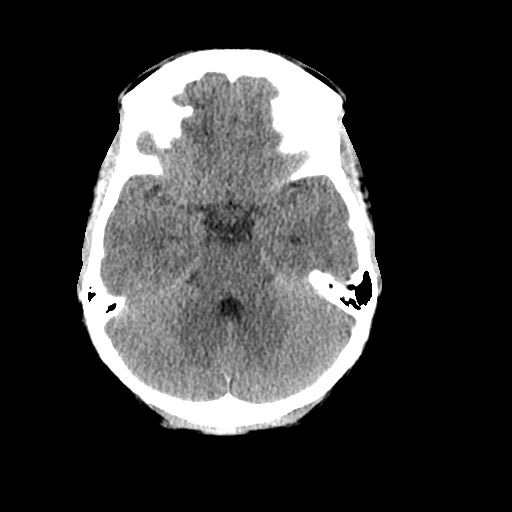
[im 12/31  brain]
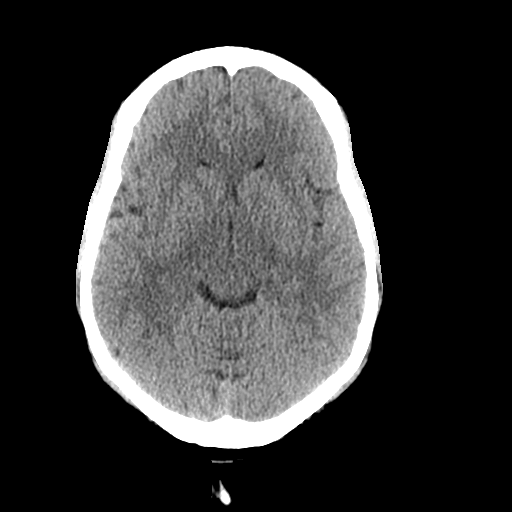
[im 16/31  brain]
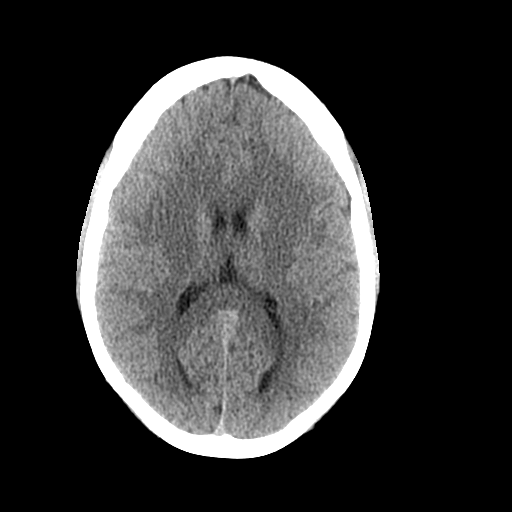
[im 16/31  bone]
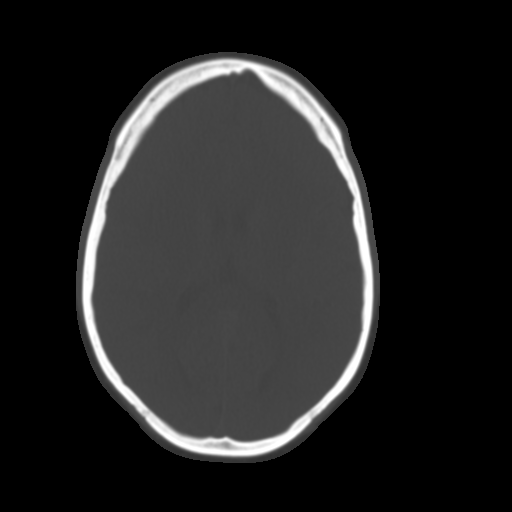
[im 19/31  brain]
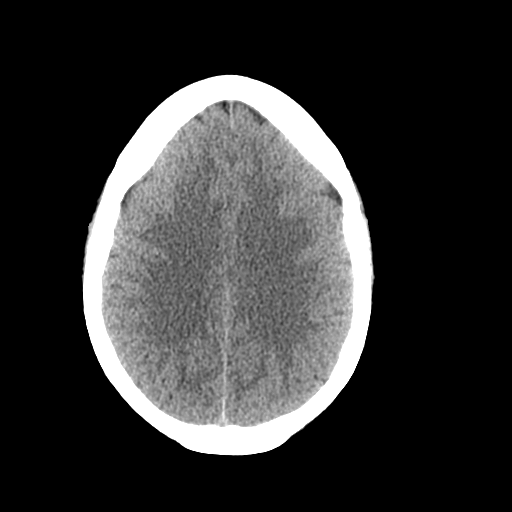
[im 22/31  brain]
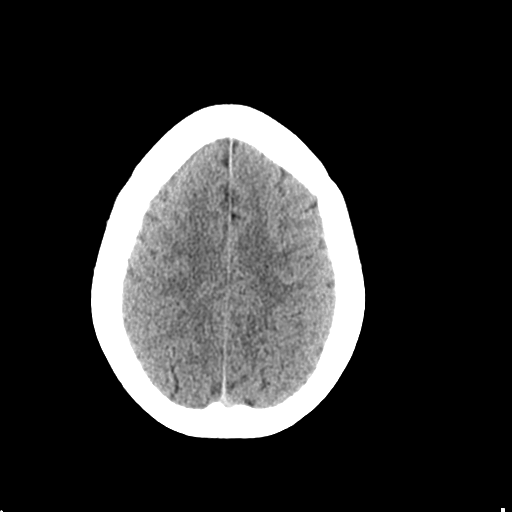
[im 25/31  brain]
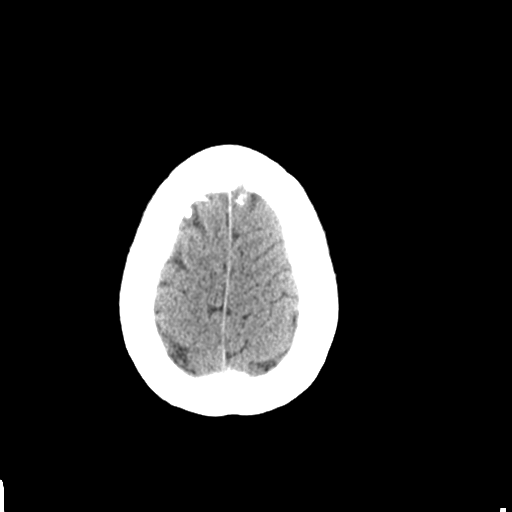
[im 28/31  brain]
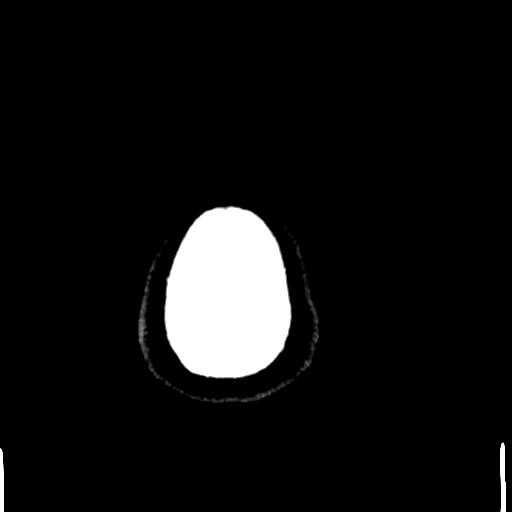
[im 28/31  bone]
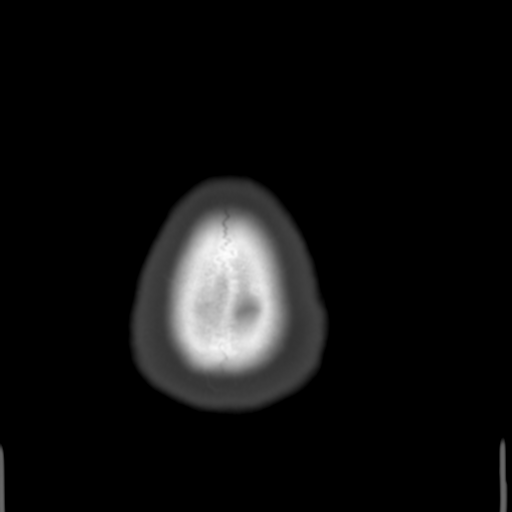

[Series 4: coronal soft · coronal · 0.34mm/px · 3 of 73 slices shown]
[im 25/73  brain]
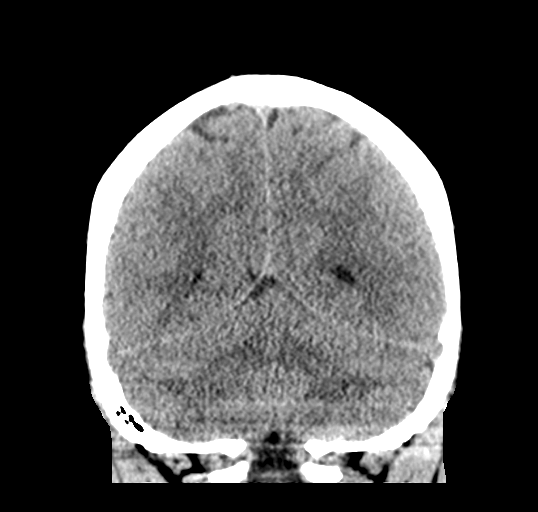
[im 33/73  brain]
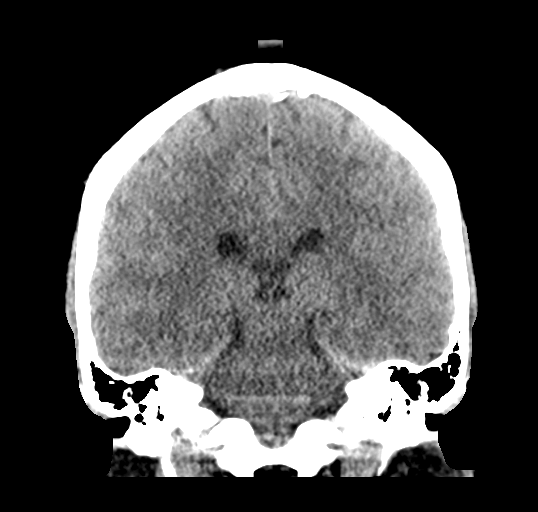
[im 41/73  brain]
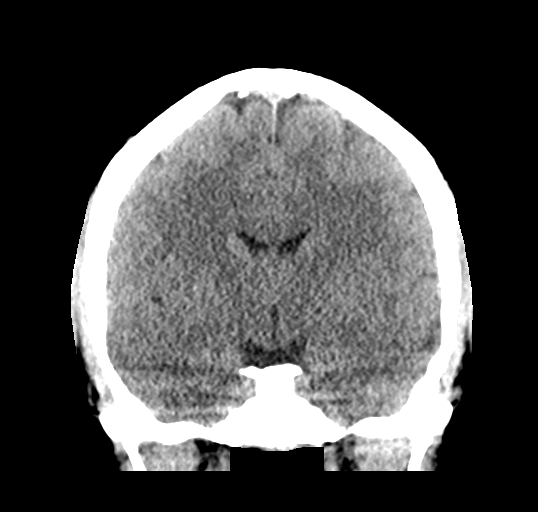

[Series 5: sagittal soft · sagittal · 0.31mm/px · 3 of 57 slices shown]
[im 19/57  brain]
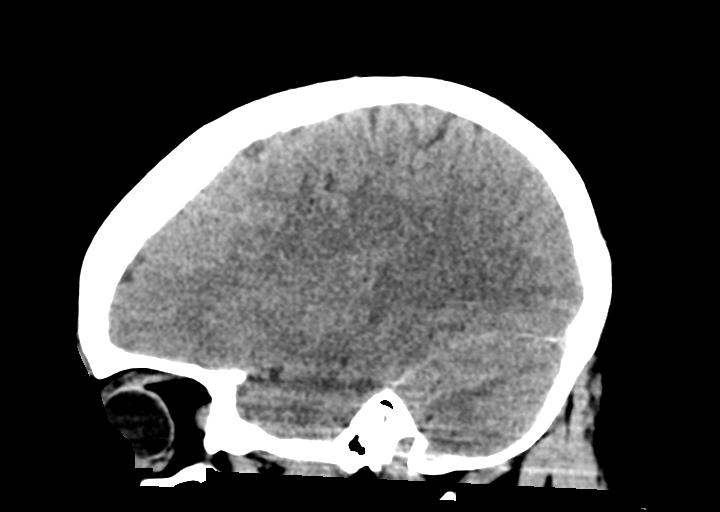
[im 29/57  brain]
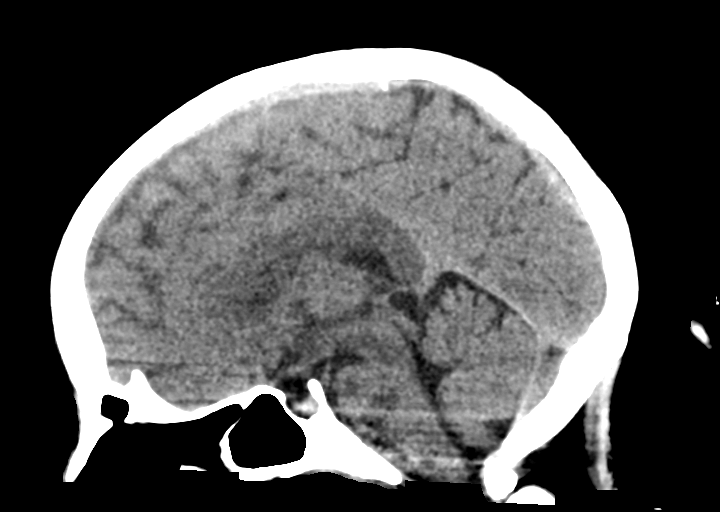
[im 38/57  brain]
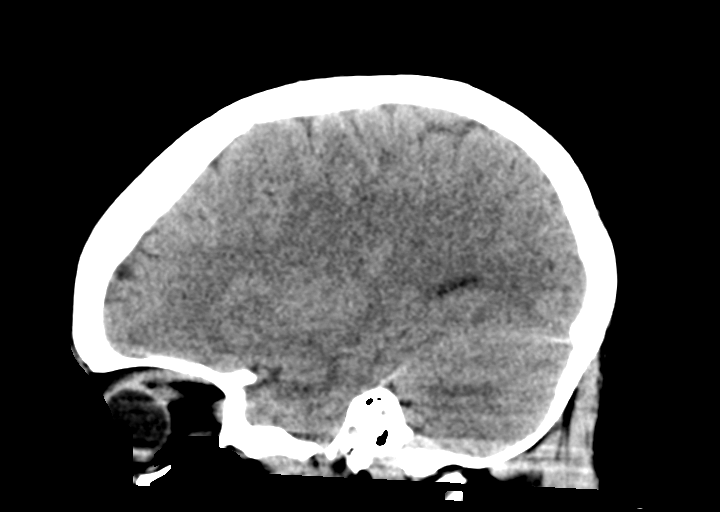

[15 of 47 positions shown; findings below may reference images not displayed]

FINDINGS: Brain: No acute intracranial abnormality. Specifically, no
hemorrhage, hydrocephalus, mass lesion, acute infarction, or
significant intracranial injury.

Vascular: No hyperdense vessel or unexpected calcification.

Skull: No acute calvarial abnormality.

Sinuses/Orbits: No acute findings

Other: None
IMPRESSION: No acute intracranial abnormality.

## 2023-04-23 ENCOUNTER — Encounter: Payer: Medicaid Other | Admitting: Nurse Practitioner

## 2023-08-02 ENCOUNTER — Ambulatory Visit
Admission: EM | Admit: 2023-08-02 | Discharge: 2023-08-02 | Disposition: A | Discharge status: Home / Self Care | Payer: Self-pay | Attending: Nurse Practitioner | Admitting: Nurse Practitioner

## 2023-08-02 DIAGNOSIS — N3000 Acute cystitis without hematuria: Secondary | ICD-10-CM | POA: Insufficient documentation

## 2023-08-02 LAB — POCT URINALYSIS DIP (MANUAL ENTRY)
Bilirubin, UA: NEGATIVE
Glucose, UA: NEGATIVE mg/dL
Nitrite, UA: POSITIVE — AB
Protein Ur, POC: 100 mg/dL — AB
Spec Grav, UA: 1.02 (ref 1.010–1.025)
Urobilinogen, UA: 1 U/dL
pH, UA: 7 (ref 5.0–8.0)

## 2023-08-02 MED ORDER — CEPHALEXIN 500 MG PO CAPS
500.0000 mg | ORAL_CAPSULE | Freq: Two times a day (BID) | ORAL | 0 refills | Status: DC
Start: 2023-08-02 — End: 2023-08-06

## 2023-08-02 NOTE — Discharge Instructions (Addendum)
 The urinalysis shows that you do have a urinary tract infection.  A urine culture has been ordered.  In the event the medication needs to be changed, you will be contacted.  You will also have access to the results via MyChart. May take over-the-counter Tylenol as needed for pain or discomfort, Increase fluids.  Try to drink at least 8-10 8 ounce glasses of water daily while symptoms persist. Avoid the use of caffeine to include tea, soda, and coffee while symptoms persist. Develop a toileting schedule that will allow you to urinate every 2 hours. Go to the emergency department immediately if you experience worsening or urinary symptoms, or new symptoms of fever, chills, abdominal pain, with worsening urinary symptoms. Follow-up as needed.

## 2023-08-02 NOTE — ED Triage Notes (Signed)
 Pt reports she has lower abdominal pain on right side, aching with urination, and low back pain x 2 weeks

## 2023-08-02 NOTE — ED Provider Notes (Signed)
 [facility]    CSN: [id number] Arrival date & time: 08/02/23  1436      History   Chief Complaint No chief complaint on file.   HPI Lindsey Beard is a 33 y.o. female.   The history is provided by the patient.   Patient presents with a 2-week history of pain with urination, low back pain, and right sided lower abdominal pain.  Patient denies fever, chills, chest pain, nausea, vomiting, diarrhea, hematuria, decreased urine stream, flank pain, or vaginal symptoms.  Patient states that she did take ibuprofen with minimal relief for the back pain.  Denies history of recurrent UTIs or pyelonephritis.  LMP/7/25.  Past Medical History:  Diagnosis Date   Hx of chlamydia infection    Hx of varicella    Medical history non-contributory    Pregnant     Patient Active Problem List   Diagnosis Date Noted   Left lower quadrant abdominal pain 04/23/2022   Weight gain 04/23/2022   Gastroesophageal reflux disease 07/26/2021   Dizziness 07/26/2021   Syncope 07/26/2021   Routine general medical examination at a health care facility 12/29/2015   Short stature 02/16/2014    Past Surgical History:  Procedure Laterality Date   CESAREAN SECTION N/A 03/01/2014   Procedure: CESAREAN SECTION;  Surgeon: Lizette Righter. Wynona Hedger, MD;  Location: [facility];  Service: Obstetrics;  Laterality: N/A;   dilitation and curretage     NO PAST SURGERIES      OB History     Gravida  2   Para  1   Term  1   Preterm      AB  1   Living  1      SAB  1   IAB      Ectopic      Multiple  0   Live Births  1            Home Medications    Prior to Admission medications   Medication Sig Start Date End Date Taking? Authorizing Provider  phentermine (ADIPEX-P) 37.5 MG tablet Take 1 tablet (37.5 mg total) by mouth daily before breakfast. 05/16/22   Early, Sara E, NP  cephALEXin (KEFLEX) 500 MG capsule Take 1 capsule (500 mg total) by mouth 2 (two) times daily. Patient not taking:  Reported on 04/20/2022 03/19/22   Corbin Dess, PA-C  cetirizine (ZYRTEC ALLERGY) 10 MG tablet Take 1 tablet (10 mg total) by mouth daily. Patient not taking: Reported on 04/20/2022 03/19/22   Corbin Dess, PA-C  methocarbamol (ROBAXIN) 500 MG tablet Take 1 tablet (500 mg total) by mouth 2 (two) times daily as needed for muscle spasms. 06/24/22   Early Glisson, MD  naproxen (NAPROSYN) 500 MG tablet Take 1 tablet (500 mg total) by mouth 2 (two) times daily with a meal. 06/24/22   Early Glisson, MD  pantoprazole (PROTONIX) 40 MG tablet Take 1 tablet (40 mg total) by mouth daily. For stomach pain. 04/20/22   Early, Sara E, NP  triamcinolone cream (KENALOG) 0.1 % Apply 1 Application topically 2 (two) times daily. Patient not taking: Reported on 04/20/2022 03/19/22   Corbin Dess, PA-C    Family History Family History  Problem Relation Age of Onset   Diabetes Mother    Diabetes Maternal Grandmother    Gout Maternal Grandfather    Cancer Maternal Aunt     Social History Social History   Tobacco Use   Smoking status: Never   Smokeless  tobacco: Never  Vaping Use   Vaping status: Never Used  Substance Use Topics   Alcohol use: Yes    Comment: occ   Drug use: Never     Allergies   Patient has no known allergies.   Review of Systems Review of Systems Per HPI  Physical Exam Triage Vital Signs ED Triage Vitals  Encounter Vitals Group     BP 08/02/23 1456 (!) 109/54     Systolic BP Percentile --      Diastolic BP Percentile --      Pulse Rate 08/02/23 1456 83     Resp 08/02/23 1456 18     Temp 08/02/23 1456 98.5 F (36.9 C)     Temp Source 08/02/23 1456 Oral     SpO2 08/02/23 1456 97 %     Weight --      Height --      Head Circumference --      Peak Flow --      Pain Score 08/02/23 1457 5     Pain Loc --      Pain Education --      Exclude from Growth Chart --    No data found.  Updated Vital Signs BP (!) 109/54 (BP Location: Right Arm)   Pulse 83    Temp 98.5 F (36.9 C) (Oral)   Resp 18   LMP 07/23/2023   SpO2 97%   Visual Acuity Right Eye Distance:   Left Eye Distance:   Bilateral Distance:    Right Eye Near:   Left Eye Near:    Bilateral Near:     Physical Exam Vitals and nursing note reviewed.  Constitutional:      General: She is not in acute distress.    Appearance: Normal appearance.  HENT:     Head: Normocephalic.  Eyes:     Extraocular Movements: Extraocular movements intact.     Pupils: Pupils are equal, round, and reactive to light.  Cardiovascular:     Rate and Rhythm: Normal rate and regular rhythm.     Pulses: Normal pulses.     Heart sounds: Normal heart sounds.  Pulmonary:     Effort: Pulmonary effort is normal. No respiratory distress.     Breath sounds: Normal breath sounds. No stridor. No wheezing, rhonchi or rales.  Abdominal:     General: Bowel sounds are normal.     Palpations: Abdomen is soft.     Tenderness: There is abdominal tenderness in the suprapubic area.  Musculoskeletal:     Cervical back: Normal range of motion.  Skin:    General: Skin is warm and dry.  Neurological:     General: No focal deficit present.     Mental Status: She is alert and oriented to person, place, and time.  Psychiatric:        Mood and Affect: Mood normal.        Behavior: Behavior normal.      UC Treatments / Results  Labs (all labs ordered are listed, but only abnormal results are displayed) Labs Reviewed  POCT URINALYSIS DIP (MANUAL ENTRY) - Abnormal; Notable for the following components:      Result Value   Clarity, UA cloudy (*)    Ketones, POC UA trace (5) (*)    Blood, UA moderate (*)    Protein Ur, POC =100 (*)    Nitrite, UA Positive (*)    Leukocytes, UA Small (1+) (*)    All other components  within normal limits    EKG   Radiology No results found.  Procedures Procedures (including critical care time)  Medications Ordered in UC Medications - No data to display  Initial  Impression / Assessment and Plan / UC Course  I have reviewed the triage vital signs and the nursing notes.  Pertinent labs & imaging results that were available during my care of the patient were reviewed by me and considered in my medical decision making (see chart for details).  Urinalysis positive for nitrites and leukocytes.  Symptoms consistent with acute cystitis.  Will treat with Keflex 500 mg twice daily for the next 7 days, urine culture is pending.  Supportive care recommendations were provided and discussed with the patient to include fluids, rest, over-the-counter analgesics, developing toileting schedule, and avoiding caffeine.  Patient was given strict ER follow-up precautions.  Patient was in agreement with this plan of care and verbalizes understanding.  All questions were answered.  Patient stable for discharge.  Final Clinical Impressions(s) / UC Diagnoses   Final diagnoses:  Acute cystitis without hematuria   Discharge Instructions   None    ED Prescriptions   None    PDMP not reviewed this encounter.   Hardy Lia, NP 08/02/23 1514

## 2023-08-04 LAB — URINE CULTURE: Culture: >=100000 — AB

## 2023-08-06 ENCOUNTER — Other Ambulatory Visit: Payer: Self-pay | Admitting: Nurse Practitioner

## 2023-08-06 ENCOUNTER — Telehealth (HOSPITAL_COMMUNITY): Payer: Self-pay

## 2023-08-06 MED ORDER — CEPHALEXIN 500 MG PO CAPS: 500.0000 mg | ORAL_CAPSULE | Freq: Two times a day (BID) | ORAL | 0 refills | Status: AC

## 2023-08-06 NOTE — Telephone Encounter (Signed)
 Pt reports losing prescription. Requests meds be resent.
